# Patient Record
Sex: Male | Born: 1965 | Race: Black or African American | Hispanic: No | Marital: Single | Smoking: Light tobacco smoker
Health system: Southern US, Community
[De-identification: ages and names within clinical notes are randomized; demographics above are authoritative.]

## PROBLEM LIST (undated history)

## (undated) DIAGNOSIS — E785 Hyperlipidemia, unspecified: Secondary | ICD-10-CM

## (undated) DIAGNOSIS — I2510 Atherosclerotic heart disease of native coronary artery without angina pectoris: Secondary | ICD-10-CM

## (undated) DIAGNOSIS — Z720 Tobacco use: Secondary | ICD-10-CM

---

## 2012-07-19 ENCOUNTER — Ambulatory Visit (INDEPENDENT_AMBULATORY_CARE_PROVIDER_SITE_OTHER): Payer: BC Managed Care – PPO | Admitting: Physician Assistant

## 2012-07-19 ENCOUNTER — Ambulatory Visit: Payer: BC Managed Care – PPO

## 2012-07-19 VITALS — BP 120/90 | HR 88 | Temp 99.7°F | Resp 18 | Ht 70.0 in | Wt 181.0 lb

## 2012-07-19 DIAGNOSIS — R05 Cough: Secondary | ICD-10-CM

## 2012-07-19 DIAGNOSIS — R509 Fever, unspecified: Secondary | ICD-10-CM

## 2012-07-19 DIAGNOSIS — R059 Cough, unspecified: Secondary | ICD-10-CM

## 2012-07-19 DIAGNOSIS — R9389 Abnormal findings on diagnostic imaging of other specified body structures: Secondary | ICD-10-CM

## 2012-07-19 DIAGNOSIS — J111 Influenza due to unidentified influenza virus with other respiratory manifestations: Secondary | ICD-10-CM

## 2012-07-19 LAB — POCT CBC
Granulocyte percent: 71.4 %G (ref 37–80)
HCT, POC: 51.9 % (ref 43.5–53.7)
Hemoglobin: 17 g/dL (ref 14.1–18.1)
Lymph, poc: 2.1 (ref 0.6–3.4)
MCH, POC: 31.5 pg — AB (ref 27–31.2)
MCHC: 32.8 g/dL (ref 31.8–35.4)
MCV: 96.3 fL (ref 80–97)
MID (cbc): 0.8 (ref 0–0.9)
MPV: 6.9 fL (ref 0–99.8)
POC Granulocyte: 7.2 — AB (ref 2–6.9)
POC LYMPH PERCENT: 20.8 %L (ref 10–50)
POC MID %: 7.8 %M (ref 0–12)
Platelet Count, POC: 292 10*3/uL (ref 142–424)
RBC: 5.39 M/uL (ref 4.69–6.13)
RDW, POC: 14.2 %
WBC: 10.1 10*3/uL (ref 4.6–10.2)

## 2012-07-19 LAB — POCT INFLUENZA A/B
Influenza A, POC: POSITIVE
Influenza B, POC: POSITIVE

## 2012-07-19 MED ORDER — OSELTAMIVIR PHOSPHATE 75 MG PO CAPS: 75.0000 mg | ORAL_CAPSULE | Freq: Two times a day (BID) | ORAL | Status: DC

## 2012-07-19 NOTE — Progress Notes (Signed)
  Subjective:    Patient ID: Hayden Ramirez, male    DOB: 1966-06-08, 47 y.o.   MRN: [id number]  HPI 47 year old male presents with 3 day history of scratchy throat, fatigue, and fever with body aches.  Has slight cough but has no nasal congestion, otalgia, or sinus pressure.  Denies abdominal pain, nausea, vomiting, or dyspnea.  Since onset of this illness he has had      Review of Systems  Constitutional: Positive for fever and chills.  HENT: Positive for sore throat and postnasal drip. Negative for congestion and rhinorrhea.   Respiratory: Negative for cough, chest tightness and wheezing.   Cardiovascular: Negative for chest pain.  Gastrointestinal: Negative for nausea, vomiting and abdominal pain.  Skin: Negative for rash.  Neurological: Negative for dizziness, numbness and headaches.       Objective:   Physical Exam  Constitutional: He is oriented to person, place, and time. He appears well-developed and well-nourished.  HENT:  Head: Normocephalic and atraumatic.  Right Ear: Hearing, tympanic membrane, external ear and ear canal normal.  Left Ear: Hearing, tympanic membrane, external ear and ear canal normal.  Mouth/Throat: Uvula is midline, oropharynx is clear and moist and mucous membranes are normal. No oropharyngeal exudate or tonsillar abscesses.  Eyes: Conjunctivae normal are normal.  Neck: Normal range of motion. Neck supple.  Cardiovascular: Normal rate, regular rhythm and normal heart sounds.   Pulmonary/Chest: Effort normal. He has wheezes (slight wheezes in bilateral bases).  Lymphadenopathy:    He has no cervical adenopathy.  Neurological: He is alert and oriented to person, place, and time.  Skin: Nails show clubbing.  Psychiatric: He has a normal mood and affect. His behavior is normal. Judgment and thought content normal.      [facility] reading (PRIMARY) by  Dr. Cleta Alberts as no acute infiltrate or consolidation.  RLL possible pulmonary nodule.      Assessment &  Plan:   1. Influenza    2. Cough  POCT CBC, DG Chest 2 View  3. Fever  POCT CBC, POCT Influenza A/B  4. Abnormal chest x-ray  CT Chest W Contrast, Ambulatory referral to Pulmonology   Start Tamiflu bid x 5 days Ok to call in [place] if cough develops.  Tylenol and ibuprofen as needed for fever and chills Will go ahead and get CT scan of chest to evaluate possible pulmonary nodule.   Referral to Pulmonology for work up of possible clubbing of nails.  RTC if symptoms worsen or fail to improve.

## 2012-08-09 ENCOUNTER — Institutional Professional Consult (permissible substitution): Payer: Self-pay | Admitting: Internal Medicine

## 2015-07-09 ENCOUNTER — Ambulatory Visit (INDEPENDENT_AMBULATORY_CARE_PROVIDER_SITE_OTHER): Payer: BLUE CROSS/BLUE SHIELD | Admitting: Physician Assistant

## 2015-07-09 VITALS — BP 120/80 | HR 70 | Temp 98.6°F | Resp 16 | Ht 71.0 in | Wt 185.0 lb

## 2015-07-09 DIAGNOSIS — Z111 Encounter for screening for respiratory tuberculosis: Secondary | ICD-10-CM

## 2015-07-09 DIAGNOSIS — Z789 Other specified health status: Secondary | ICD-10-CM | POA: Diagnosis not present

## 2015-07-09 DIAGNOSIS — Z23 Encounter for immunization: Secondary | ICD-10-CM | POA: Diagnosis not present

## 2015-07-09 NOTE — Patient Instructions (Addendum)
I will contact you with the blood work and what test you need, pending my return phone call.  Tdap Vaccine (Tetanus, Diphtheria and Pertussis): What You Need to Know 1. Why get vaccinated? Tetanus, diphtheria and pertussis are very serious diseases. Tdap vaccine can protect [place] from these diseases. And, Tdap vaccine given to pregnant women can protect newborn babies against pertussis. TETANUS (Lockjaw) is rare in the [place] today. It causes painful muscle tightening and stiffness, usually all over the body.  It can lead to tightening of muscles in the head and neck so you can't open your mouth, swallow, or sometimes even breathe. Tetanus kills about 1 out of 10 people who are infected even after receiving the best medical care. DIPHTHERIA is also rare in the [place] today. It can cause a thick coating to form in the back of the throat.  It can lead to breathing problems, heart failure, paralysis, and death. PERTUSSIS (Whooping Cough) causes severe coughing spells, which can cause difficulty breathing, vomiting and disturbed sleep.  It can also lead to weight loss, incontinence, and rib fractures. Up to 2 in 100 adolescents and 5 in 100 adults with pertussis are hospitalized or have complications, which could include pneumonia or death. These diseases are caused by bacteria. Diphtheria and pertussis are spread from person to person through secretions from coughing or sneezing. Tetanus enters the body through cuts, scratches, or wounds. Before vaccines, as many as 200,000 cases of diphtheria, 200,000 cases of pertussis, and hundreds of cases of tetanus, were reported in the [place] each year. Since vaccination began, reports of cases for tetanus and diphtheria have dropped by about 99% and for pertussis by about 80%. 2. Tdap vaccine Tdap vaccine can protect adolescents and adults from tetanus, diphtheria, and pertussis. One dose of Tdap is routinely given at age 77 or 20. People who  did not get Tdap at that age should get it as soon as possible. Tdap is especially important for healthcare professionals and anyone having close contact with a baby younger than 12 months. Pregnant women should get a dose of Tdap during every pregnancy, to protect the newborn from pertussis. Infants are most at risk for severe, life-threatening complications from pertussis. Another vaccine, called Td, protects against tetanus and diphtheria, but not pertussis. A Td booster should be given every 10 years. Tdap may be given as one of these boosters if you have never gotten Tdap before. Tdap may also be given after a severe cut or burn to prevent tetanus infection. Your doctor or the person giving you the vaccine can give you more information. Tdap may safely be given at the same time as other vaccines. 3. Some people should not get this vaccine  A person who has ever had a life-threatening allergic reaction after a previous dose of any diphtheria, tetanus or pertussis containing vaccine, OR has a severe allergy to any part of this vaccine, should not get Tdap vaccine. Tell the person giving the vaccine about any severe allergies.  Anyone who had coma or long repeated seizures within 7 days after a childhood dose of DTP or DTaP, or a previous dose of Tdap, should not get Tdap, unless a cause other than the vaccine was found. They can still get Td.  Talk to your doctor if you:  have seizures or another nervous system problem,  had severe pain or swelling after any vaccine containing diphtheria, tetanus or pertussis,  ever had a condition called Guillain-Barr Syndrome (GBS),  aren't feeling well on the day the shot is scheduled. 4. Risks With any medicine, including vaccines, there is a chance of side effects. These are usually mild and go away on their own. Serious reactions are also possible but are rare. Most people who get Tdap vaccine do not have any problems with it. Mild problems  following Tdap (Did not interfere with activities)  Pain where the shot was given (about 3 in 4 adolescents or 2 in 3 adults)  Redness or swelling where the shot was given (about 1 person in 5)  Mild fever of at least 100.41F (up to about 1 in 25 adolescents or 1 in 100 adults)  Headache (about 3 or 4 people in 10)  Tiredness (about 1 person in 3 or 4)  Nausea, vomiting, diarrhea, stomach ache (up to 1 in 4 adolescents or 1 in 10 adults)  Chills, sore joints (about 1 person in 10)  Body aches (about 1 person in 3 or 4)  Rash, swollen glands (uncommon) Moderate problems following Tdap (Interfered with activities, but did not require medical attention)  Pain where the shot was given (up to 1 in 5 or 6)  Redness or swelling where the shot was given (up to about 1 in 16 adolescents or 1 in 12 adults)  Fever over 102F (about 1 in 100 adolescents or 1 in 250 adults)  Headache (about 1 in 7 adolescents or 1 in 10 adults)  Nausea, vomiting, diarrhea, stomach ache (up to 1 or 3 people in 100)  Swelling of the entire arm where the shot was given (up to about 1 in 500). Severe problems following Tdap (Unable to perform usual activities; required medical attention)  Swelling, severe pain, bleeding and redness in the arm where the shot was given (rare). Problems that could happen after any vaccine:  People sometimes faint after a medical procedure, including vaccination. Sitting or lying down for about 15 minutes can help prevent fainting, and injuries caused by a fall. Tell your doctor if you feel dizzy, or have vision changes or ringing in the ears.  Some people get severe pain in the shoulder and have difficulty moving the arm where a shot was given. This happens very rarely.  Any medication can cause a severe allergic reaction. Such reactions from a vaccine are very rare, estimated at fewer than 1 in a million doses, and would happen within a few minutes to a few hours after the  vaccination. As with any medicine, there is a very remote chance of a vaccine causing a serious injury or death. The safety of vaccines is always being monitored. For more information, visit: http://floyd.org/ 5. What if there is a serious problem? What should I look for?  Look for anything that concerns you, such as signs of a severe allergic reaction, very high fever, or unusual behavior.  Signs of a severe allergic reaction can include hives, swelling of the face and throat, difficulty breathing, a fast heartbeat, dizziness, and weakness. These would usually start a few minutes to a few hours after the vaccination. What should I do?  If you think it is a severe allergic reaction or other emergency that can't wait, call 9-1-1 or get the person to the nearest hospital. Otherwise, call your doctor.  Afterward, the reaction should be reported to the Vaccine Adverse Event Reporting System (VAERS). Your doctor might file this report, or you can do it yourself through the VAERS web site at www.vaers.LAgents.no, or by calling 1-304-819-0799. VAERS  does not give medical advice.  6. The National Vaccine Injury Compensation Program The [employer] Vaccine Injury Compensation Program (VICP) is a federal program that was created to compensate people who may have been injured by certain vaccines. Persons who believe they may have been injured by a vaccine can learn about the program and about filing a claim by calling 1-(364)145-6841 or visiting the VICP website at SpiritualWord.at. There is a time limit to file a claim for compensation. 7. How can I learn more?  Ask your doctor. He or she can give you the vaccine package insert or suggest other sources of information.  Call your local or state health department.  Contact the Centers for Disease Control and Prevention (CDC):  Call 586-637-1881 (1-800-CDC-INFO) or  Visit CDC's website at PicCapture.uy CDC Tdap Vaccine VIS  (08/20/13)   This information is not intended to replace advice given to you by your health care provider. Make sure you discuss any questions you have with your health care provider.   Document Released: 12/13/2011 Document Revised: 07/04/2014 Document Reviewed: 09/25/2013 Elsevier Interactive Patient Education 2016 Elsevier Inc.  Tuberculin Skin Test WHY AM I HAVING THIS TEST? Tuberculosis (TB) is a bacterial infection caused by Mycobacterium tuberculosis. Most people who are exposed to these bacteria have a strong enough defense (immune) system to prevent the bacteria from causing TB and developing symptoms. Their bodies prevent the germs from being active and making them sick (latent TB infection).  However, if you have TB germs in your body and your immune system is weak, you can develop a TB infection. This can cause symptoms such as:   Night sweats.  Fever.  Weakness.  Weight loss. A latent TB infection can also become active later in life if your immune system becomes weakened or compromised. You may have this test if your health care provider suspects that you have TB. You may also have this test to screen for TB if you are at risk for getting the disease. Those at increased risk include:  People who inject illegal drugs or share needles.  People with HIV or other diseases that affect immunity.  Health care workers.  People who live in high-risk communities, such as homeless shelters, nursing homes, and correctional facilities.  People who have been in contact with someone with TB.  People from countries where TB is more common. If you are in a high-risk group, your health care provider may wish to screen for TB more often. This can help prevent the spread of the disease. Sometimes TB screening is required when starting a new job, such as becoming a Scientist, forensic or a Runner, broadcasting/film/video. Colleges or universities may require it of new students. HOW WILL I BE TESTED? A tuberculin  skin test is the main test used to check for exposure to the bacteria that can cause TB. The test checks for antibodies to the bacteria. Antibodies are proteins that your body produces to protect you from germs and other things that can make you sick. Your health care provider will inject a solution known as PPD (purified protein derivative) under the first layer of skin on your arm. This causes a blister-like bubble to form at the site. Your health care provider will then examine the site after a number of hours have passed to see if a reaction has occurred. HOW DO I PREPARE FOR THE TEST? There is no preparation required for this test. WHAT DO THE RESULTS MEAN? Your test results will be reported as  either negative or positive.  If the tuberculin skin test produces a negative result, it is likely that you do not have TB and have not been exposed to the TB bacteria. If you or your health care provider suspects exposure, however, you may want to repeat the test a few weeks later. A blood test may also be used to check for TB. This is because you will not react to the tuberculin skin test until several weeks after exposure to TB bacteria. If you test positive to the tuberculin skin test, it is likely that you have been exposed to TB bacteria. The test does not distinguish between an active and a latent TB infection. A false-positive result can occur. A false-positive result for TB bacteria is incorrect because it indicates a condition or finding is present when it is not. Talk to your health care provider to discuss your results, treatment options, and if necessary, the need for more tests. It is your responsibility to obtain your test results. Ask the lab or department performing the test when and how you will get your results. Talk with your health care provider if you have any questions about your results.   This information is not intended to replace advice given to you by your health care provider.  Make sure you discuss any questions you have with your health care provider.   Document Released: 03/23/2005 Document Revised: 07/04/2014 Document Reviewed: 10/07/2013 Elsevier Interactive Patient Education 2016 Elsevier Inc.  Influenza Virus Vaccine injection (Fluarix) What is this medicine? INFLUENZA VIRUS VACCINE (in floo EN zuh VAHY ruhs vak SEEN) helps to reduce the risk of getting influenza also known as the flu. This medicine may be used for other purposes; ask your health care provider or pharmacist if you have questions. What should I tell my health care provider before I take this medicine? They need to know if you have any of these conditions: -bleeding disorder like hemophilia -fever or infection -Guillain-Barre syndrome or other neurological problems -immune system problems -infection with the human immunodeficiency virus (HIV) or AIDS -low blood platelet counts -multiple sclerosis -an unusual or allergic reaction to influenza virus vaccine, eggs, chicken proteins, latex, gentamicin, other medicines, foods, dyes or preservatives -pregnant or trying to get pregnant -breast-feeding How should I use this medicine? This vaccine is for injection into a muscle. It is given by a health care professional. A copy of Vaccine Information Statements will be given before each vaccination. Read this sheet carefully each time. The sheet may change frequently. Talk to your pediatrician regarding the use of this medicine in children. Special care may be needed. Overdosage: If you think you have taken too much of this medicine contact a poison control center or emergency room at once. NOTE: This medicine is only for you. Do not share this medicine with others. What if I miss a dose? This does not apply. What may interact with this medicine? -chemotherapy or radiation therapy -medicines that lower your immune system like etanercept, anakinra, infliximab, and adalimumab -medicines that  treat or prevent blood clots like warfarin -phenytoin -steroid medicines like prednisone or cortisone -theophylline -vaccines This list may not describe all possible interactions. Give your health care provider a list of all the medicines, herbs, non-prescription drugs, or dietary supplements you use. Also tell them if you smoke, drink alcohol, or use illegal drugs. Some items may interact with your medicine. What should I watch for while using this medicine? Report any side effects that do not go away  within 3 days to your doctor or health care professional. Call your health care provider if any unusual symptoms occur within 6 weeks of receiving this vaccine. You may still catch the flu, but the illness is not usually as bad. You cannot get the flu from the vaccine. The vaccine will not protect against colds or other illnesses that may cause fever. The vaccine is needed every year. What side effects may I notice from receiving this medicine? Side effects that you should report to your doctor or health care professional as soon as possible: -allergic reactions like skin rash, itching or hives, swelling of the face, lips, or tongue Side effects that usually do not require medical attention (report to your doctor or health care professional if they continue or are bothersome): -fever -headache -muscle aches and pains -pain, tenderness, redness, or swelling at site where injected -weak or tired This list may not describe all possible side effects. Call your doctor for medical advice about side effects. You may report side effects to FDA at 1-800-FDA-1088. Where should I keep my medicine? This vaccine is only given in a clinic, pharmacy, doctor's office, or other health care setting and will not be stored at home. NOTE: This sheet is a summary. It may not cover all possible information. If you have questions about this medicine, talk to your doctor, pharmacist, or health care provider.    2016,  Elsevier/Gold Standard. (2008-01-09 09:30:40)

## 2015-07-09 NOTE — Progress Notes (Signed)
Chief Complaint  Patient presents with  . Immunizations    ppd and mmr   HPI Patient is here today for immunizations for his workplace.  Vendor requirement from [facility].  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   He has never had a positive TB.   He has no medical records or means of obtaining records. He is from [facility], and went to [employer].  Tuberculosis Risk Questionnaire  1. No Were you born outside the [place] in one of the following parts of the world: [place], [place], [place], [place] or [place]?    2. No Have you traveled outside the [place] and lived for more than one month in one of the following parts of the world: [place], [place], [place], [place] or [place]?    3. No Do you have a compromised immune system such as from any of the following conditions:HIV/AIDS, organ or bone marrow transplantation, diabetes, immunosuppressive medicines (e.g. Prednisone, Remicaide), leukemia, lymphoma, cancer of the head or neck, gastrectomy or jejunal bypass, end-stage renal disease (on dialysis), or silicosis?     4. 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 --working at a hospital    5. No Have you ever: injected illegal drugs, used crack cocaine, lived in a homeless shelter  or been in jail or prison?     6. No Have you ever been exposed to anyone with infectious tuberculosis?    Tuberculosis Symptom Questionnaire  Do you currently have any of the following symptoms?  1. No Unexplained cough lasting more than 3 weeks?   2. No Unexplained fever lasting more than 3 weeks.   3. No Night Sweats (sweating that leaves the bedclothes and sheets wet)     4. No Shortness of Breath   5. No Chest Pain   6. No Unintentional weight loss    7. No Unexplained fatigue (very tired for no reason)    No current  Medications at this time.  History reviewed. No pertinent past surgical history.  History reviewed. No pertinent family history.  Social History   Social History  . Marital Status: Single    Spouse Name: N/A  . Number of Children: N/A  . Years of Education: N/A   Social History Main Topics  . Smoking status: Current Every Day Smoker  . Smokeless tobacco: None  . Alcohol Use: Yes  . Drug Use: No  . Sexual Activity: Not Asked   Other Topics Concern  . None   Social History Narrative   Physical Exam  Constitutional: He is oriented to person, place, and time. He appears well-developed and well-nourished. No distress.  HENT:  Head: Normocephalic and atraumatic.  Eyes: Conjunctivae and EOM are normal. Pupils are equal, round, and reactive to light.  Cardiovascular: Normal rate.   Pulmonary/Chest: Effort normal. No respiratory distress.  Neurological: He is alert and oriented to person, place, and time.  Skin: Skin is warm and dry. He is not diaphoretic.  Psychiatric: He has a normal mood and affect. His behavior is normal.    Assessment and Plan: 50 year old male is here today for immunizations. Several phone calls were spent contact [facility] [place] to obtain his medical records.  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   Placed tdap and flu vaccine He  will have the first skin test today and another after at least one week. I will place a copy of his forms in my personal box, if he needs to return MMR and varicella titer performed today.  Encounter for immunization  PPD screening test - Plan: TB Skin Test  Need for prophylactic vaccination and inoculation against influenza - Plan: Flu Vaccine QUAD 36+ mos IM  Need for Tdap vaccination - Plan: Tdap vaccine greater than or equal to 7yo IM  Measles, mumps, rubella (MMR) vaccination status unknown - Plan: Measles/Mumps/Rubella Immunity  Unknown varicella vaccination  status - Plan: Varicella zoster antibody, IgG   Ivar Drape, PA-C [facility] 1/13/20179:06 AM

## 2015-07-10 ENCOUNTER — Other Ambulatory Visit: Payer: BLUE CROSS/BLUE SHIELD

## 2015-07-10 ENCOUNTER — Encounter: Payer: Self-pay | Admitting: Physician Assistant

## 2015-07-10 ENCOUNTER — Telehealth: Payer: Self-pay | Admitting: Physician Assistant

## 2015-07-10 NOTE — Telephone Encounter (Signed)
Pt will come in this afternoon

## 2015-07-10 NOTE — Telephone Encounter (Signed)
I'm sure they drew it, but we can't send [facility] without any orders. They would have discarded it at the end of the day. I will call pt and have him RTC

## 2015-07-10 NOTE — Telephone Encounter (Signed)
I placed an MMR and Varicella titer for a patient after making many contacts.  I told the lab beforehand to draw his blood, and if I had no contacts of his immunization records, I would go ahead and order the titers.  It appears that they never sent the labs once I placed them.  Please see if these labs were sent off.  If not, the patient needs to be called before he returns for his tb read, to advise that he needs to also get his blood drawn again, unfortunately.

## 2015-07-11 ENCOUNTER — Encounter: Payer: BLUE CROSS/BLUE SHIELD | Admitting: *Deleted

## 2015-07-11 DIAGNOSIS — Z111 Encounter for screening for respiratory tuberculosis: Secondary | ICD-10-CM

## 2015-07-11 LAB — TB SKIN TEST
Induration: 0 mm
TB Skin Test: NEGATIVE

## 2015-07-13 LAB — VARICELLA ZOSTER ANTIBODY, IGG: VARICELLA IGG: 2840 {index} — AB (ref <135.00)

## 2015-07-13 LAB — MEASLES/MUMPS/RUBELLA IMMUNITY
MUMPS IGG: 23.7 [AU]/ml — AB (ref <9.00)
RUBELLA: 0.72 {index} (ref <0.90)

## 2015-07-14 NOTE — Telephone Encounter (Signed)
Please advise the patient that he is not immune to measles or rubella and must have this immunization per his job requirement.  Let's finish the tb test AND reads (he must do 2 TB skin test at least a week apart).  After that 2nd TB skin test reading is negative--we can do the MMR vaccine and repeat the 2nd dose in 1 month.  He is able to start his job in between the 1st and 2nd dose.

## 2015-07-15 NOTE — Telephone Encounter (Signed)
Pt notified. Future order placed for MMR

## 2015-07-23 ENCOUNTER — Ambulatory Visit (INDEPENDENT_AMBULATORY_CARE_PROVIDER_SITE_OTHER): Payer: BLUE CROSS/BLUE SHIELD

## 2015-07-23 DIAGNOSIS — Z111 Encounter for screening for respiratory tuberculosis: Secondary | ICD-10-CM | POA: Diagnosis not present

## 2015-07-26 ENCOUNTER — Ambulatory Visit (INDEPENDENT_AMBULATORY_CARE_PROVIDER_SITE_OTHER): Payer: BLUE CROSS/BLUE SHIELD | Admitting: Family Medicine

## 2015-07-26 ENCOUNTER — Encounter: Payer: Self-pay | Admitting: Family Medicine

## 2015-07-26 DIAGNOSIS — Z23 Encounter for immunization: Secondary | ICD-10-CM

## 2015-07-26 LAB — TB SKIN TEST
Induration: 0 mm
TB Skin Test: NEGATIVE

## 2015-07-26 NOTE — Patient Instructions (Signed)
Return in one month for second MMR

## 2015-07-26 NOTE — Progress Notes (Signed)
   Subjective:    Patient ID: Hayden Ramirez, male    DOB: July 24, 1965, 50 y.o.   MRN: [id number]  07/26/2015  Immunizations   HPI This 50 y.o. male presents for follow-up for Tb skin test read and MMR.  Also needs form completed for school.  Recent lab results reviewed in detail. CMA visit only.     Review of Systems  Constitutional: Negative for fever, chills, diaphoresis and fatigue.    No past medical history on file. No past surgical history on file. No Known Allergies Current Outpatient Prescriptions  Medication Sig Dispense Refill  . oseltamivir (TAMIFLU) 75 MG capsule Take 1 capsule (75 mg total) by mouth 2 (two) times daily. (Patient not taking: Reported on 07/09/2015) 10 capsule 0   No current facility-administered medications for this visit.   Social History   Social History  . Marital Status: Single    Spouse Name: N/A  . Number of Children: N/A  . Years of Education: N/A   Occupational History  . Not on file.   Social History Main Topics  . Smoking status: Current Every Day Smoker  . Smokeless tobacco: Not on file  . Alcohol Use: Yes  . Drug Use: No  . Sexual Activity: Not on file   Other Topics Concern  . Not on file   Social History Narrative   No family history on file.     Objective:    There were no vitals taken for this visit. Physical Exam  Constitutional: He appears well-developed and well-nourished.   Results for orders placed or performed in visit on 07/23/15  TB Skin Test  Result Value Ref Range   TB Skin Test Negative    Induration 0 mm       Assessment & Plan:   1. Need for MMR vaccine    S/p MMR. Tb skin test negative. Form completed/signed.  No orders of the defined types were placed in this encounter.   No orders of the defined types were placed in this encounter.    No Follow-up on file.    Catera Hankins Elayne Guerin, M.D. [facility] [address] [place], [place]   [address] [phone] phone [phone] fax

## 2016-01-30 ENCOUNTER — Encounter (HOSPITAL_COMMUNITY): Payer: Self-pay

## 2016-01-30 ENCOUNTER — Inpatient Hospital Stay (HOSPITAL_COMMUNITY)
Admission: EM | Admit: 2016-01-30 | Discharge: 2016-02-02 | DRG: 247 | Disposition: A | Discharge status: Home / Self Care | Payer: BLUE CROSS/BLUE SHIELD | Attending: Interventional Cardiology | Admitting: Interventional Cardiology

## 2016-01-30 ENCOUNTER — Encounter (HOSPITAL_COMMUNITY)
Admission: EM | Disposition: A | Discharge status: Home / Self Care | Payer: Self-pay | Source: Home / Self Care | Attending: Interventional Cardiology

## 2016-01-30 ENCOUNTER — Emergency Department (HOSPITAL_COMMUNITY): Payer: BLUE CROSS/BLUE SHIELD

## 2016-01-30 ENCOUNTER — Ambulatory Visit (HOSPITAL_COMMUNITY): Admission: EM | Admit: 2016-01-30 | Payer: Self-pay | Admitting: Interventional Cardiology

## 2016-01-30 ENCOUNTER — Other Ambulatory Visit: Payer: Self-pay

## 2016-01-30 DIAGNOSIS — E785 Hyperlipidemia, unspecified: Secondary | ICD-10-CM | POA: Diagnosis not present

## 2016-01-30 DIAGNOSIS — N179 Acute kidney failure, unspecified: Secondary | ICD-10-CM | POA: Diagnosis present

## 2016-01-30 DIAGNOSIS — I2119 ST elevation (STEMI) myocardial infarction involving other coronary artery of inferior wall: Principal | ICD-10-CM | POA: Diagnosis present

## 2016-01-30 DIAGNOSIS — I213 ST elevation (STEMI) myocardial infarction of unspecified site: Secondary | ICD-10-CM | POA: Diagnosis present

## 2016-01-30 DIAGNOSIS — Z955 Presence of coronary angioplasty implant and graft: Secondary | ICD-10-CM

## 2016-01-30 DIAGNOSIS — Z8249 Family history of ischemic heart disease and other diseases of the circulatory system: Secondary | ICD-10-CM

## 2016-01-30 DIAGNOSIS — F17210 Nicotine dependence, cigarettes, uncomplicated: Secondary | ICD-10-CM | POA: Diagnosis not present

## 2016-01-30 DIAGNOSIS — Z720 Tobacco use: Secondary | ICD-10-CM

## 2016-01-30 DIAGNOSIS — I10 Essential (primary) hypertension: Secondary | ICD-10-CM | POA: Diagnosis present

## 2016-01-30 DIAGNOSIS — I255 Ischemic cardiomyopathy: Secondary | ICD-10-CM | POA: Diagnosis present

## 2016-01-30 DIAGNOSIS — I493 Ventricular premature depolarization: Secondary | ICD-10-CM | POA: Diagnosis not present

## 2016-01-30 DIAGNOSIS — I2121 ST elevation (STEMI) myocardial infarction involving left circumflex coronary artery: Secondary | ICD-10-CM

## 2016-01-30 DIAGNOSIS — I2510 Atherosclerotic heart disease of native coronary artery without angina pectoris: Secondary | ICD-10-CM

## 2016-01-30 HISTORY — PX: CARDIAC CATHETERIZATION: SHX172

## 2016-01-30 HISTORY — DX: Atherosclerotic heart disease of native coronary artery without angina pectoris: I25.10

## 2016-01-30 HISTORY — DX: Tobacco use: Z72.0

## 2016-01-30 HISTORY — DX: Hyperlipidemia, unspecified: E78.5

## 2016-01-30 LAB — CBC WITH DIFFERENTIAL/PLATELET
Basophils Absolute: 0 10*3/uL (ref 0.0–0.1)
Basophils Relative: 0 %
Eosinophils Absolute: 0.1 10*3/uL (ref 0.0–0.7)
Eosinophils Relative: 1 %
HEMATOCRIT: 44.2 % (ref 39.0–52.0)
HEMOGLOBIN: 15.3 g/dL (ref 13.0–17.0)
LYMPHS ABS: 5.5 10*3/uL — AB (ref 0.7–4.0)
LYMPHS PCT: 37 %
MCH: 30.8 pg (ref 26.0–34.0)
MCHC: 34.6 g/dL (ref 30.0–36.0)
MCV: 89.1 fL (ref 78.0–100.0)
MONOS PCT: 7 %
Monocytes Absolute: 1 10*3/uL (ref 0.1–1.0)
NEUTROS ABS: 8 10*3/uL — AB (ref 1.7–7.7)
NEUTROS PCT: 55 %
Platelets: 297 10*3/uL (ref 150–400)
RBC: 4.96 MIL/uL (ref 4.22–5.81)
RDW: 14.1 % (ref 11.5–15.5)
WBC: 14.6 10*3/uL — AB (ref 4.0–10.5)

## 2016-01-30 LAB — I-STAT CHEM 8, ED
BUN: 10 mg/dL (ref 6–20)
CALCIUM ION: 1.14 mmol/L (ref 1.13–1.30)
CHLORIDE: 100 mmol/L — AB (ref 101–111)
Creatinine, Ser: 1.4 mg/dL — ABNORMAL HIGH (ref 0.61–1.24)
GLUCOSE: 124 mg/dL — AB (ref 65–99)
HCT: 47 % (ref 39.0–52.0)
Hemoglobin: 16 g/dL (ref 13.0–17.0)
POTASSIUM: 3 mmol/L — AB (ref 3.5–5.1)
SODIUM: 140 mmol/L (ref 135–145)
TCO2: 22 mmol/L (ref 0–100)

## 2016-01-30 LAB — BASIC METABOLIC PANEL
Anion gap: 11 (ref 5–15)
BUN: 11 mg/dL (ref 6–20)
CHLORIDE: 103 mmol/L (ref 101–111)
CO2: 22 mmol/L (ref 22–32)
CREATININE: 1.36 mg/dL — AB (ref 0.61–1.24)
Calcium: 9.4 mg/dL (ref 8.9–10.3)
GFR calc Af Amer: >60 mL/min (ref >60)
GFR calc non Af Amer: >60 mL/min (ref >60)
Glucose, Bld: 129 mg/dL — ABNORMAL HIGH (ref 65–99)
POTASSIUM: 2.9 mmol/L — AB (ref 3.5–5.1)
SODIUM: 136 mmol/L (ref 135–145)

## 2016-01-30 LAB — POCT ACTIVATED CLOTTING TIME: ACTIVATED CLOTTING TIME: 367 s

## 2016-01-30 LAB — I-STAT TROPONIN, ED: TROPONIN I, POC: 0 ng/mL (ref 0.00–0.08)

## 2016-01-30 LAB — TROPONIN I

## 2016-01-30 LAB — PROTIME-INR
INR: 0.94
Prothrombin Time: 12.6 seconds (ref 11.4–15.2)

## 2016-01-30 SURGERY — LEFT HEART CATH AND CORONARY ANGIOGRAPHY
Anesthesia: LOCAL

## 2016-01-30 MED ORDER — ACETAMINOPHEN 325 MG PO TABS: 650.0000 mg | ORAL_TABLET | ORAL | Status: DC | PRN

## 2016-01-30 MED ORDER — HEPARIN (PORCINE) IN NACL 2-0.9 UNIT/ML-% IJ SOLN
INTRAMUSCULAR | Status: DC | PRN
  Administered 2016-01-30: 1500 mL

## 2016-01-30 MED ORDER — HEPARIN (PORCINE) IN NACL 100-0.45 UNIT/ML-% IJ SOLN
INTRAMUSCULAR | Status: AC
  Filled 2016-01-30: qty 250

## 2016-01-30 MED ORDER — AMIODARONE HCL 150 MG/3ML IV SOLN
INTRAVENOUS | Status: AC
  Filled 2016-01-30: qty 3

## 2016-01-30 MED ORDER — SODIUM CHLORIDE 0.9 % WEIGHT BASED INFUSION
1.0000 mL/kg/h | INTRAVENOUS | Status: AC
  Administered 2016-01-31: 1 mL/kg/h via INTRAVENOUS

## 2016-01-30 MED ORDER — IOPAMIDOL (ISOVUE-370) INJECTION 76%
INTRAVENOUS | Status: DC | PRN
  Administered 2016-01-30: 120 mL via INTRAVENOUS

## 2016-01-30 MED ORDER — LIDOCAINE HCL (PF) 1 % IJ SOLN
INTRAMUSCULAR | Status: AC
  Filled 2016-01-30: qty 30

## 2016-01-30 MED ORDER — IOPAMIDOL (ISOVUE-370) INJECTION 76%
INTRAVENOUS | Status: AC
  Filled 2016-01-30: qty 100

## 2016-01-30 MED ORDER — TIROFIBAN HCL IN NACL 5-0.9 MG/100ML-% IV SOLN
INTRAVENOUS | Status: AC
  Filled 2016-01-30: qty 100

## 2016-01-30 MED ORDER — ASPIRIN 81 MG PO CHEW
CHEWABLE_TABLET | ORAL | Status: AC
  Filled 2016-01-30: qty 4

## 2016-01-30 MED ORDER — HEPARIN (PORCINE) IN NACL 100-0.45 UNIT/ML-% IJ SOLN: 1000.0000 [IU]/h | INTRAMUSCULAR | Status: DC

## 2016-01-30 MED ORDER — VERAPAMIL HCL 2.5 MG/ML IV SOLN
INTRAVENOUS | Status: AC
Start: 2016-01-30 — End: 2016-01-30
  Filled 2016-01-30: qty 2

## 2016-01-30 MED ORDER — SODIUM CHLORIDE 0.9% FLUSH
3.0000 mL | INTRAVENOUS | Status: DC | PRN
  Administered 2016-02-01: 3 mL via INTRAVENOUS
  Filled 2016-01-30: qty 3

## 2016-01-30 MED ORDER — SODIUM CHLORIDE 0.9 % IV SOLN
INTRAVENOUS | Status: DC | PRN
  Administered 2016-01-30: 100 mL/h via INTRAVENOUS

## 2016-01-30 MED ORDER — HEPARIN SODIUM (PORCINE) 1000 UNIT/ML IJ SOLN
INTRAMUSCULAR | Status: AC
  Filled 2016-01-30: qty 1

## 2016-01-30 MED ORDER — ATORVASTATIN CALCIUM 80 MG PO TABS
80.0000 mg | ORAL_TABLET | Freq: Every day | ORAL | Status: DC
  Administered 2016-01-31 – 2016-02-01 (×2): 80 mg via ORAL
  Filled 2016-01-30 (×2): qty 1

## 2016-01-30 MED ORDER — NITROGLYCERIN 1 MG/10 ML FOR IR/CATH LAB
INTRA_ARTERIAL | Status: AC
  Filled 2016-01-30: qty 10

## 2016-01-30 MED ORDER — MIDAZOLAM HCL 2 MG/2ML IJ SOLN
INTRAMUSCULAR | Status: AC
  Filled 2016-01-30: qty 2

## 2016-01-30 MED ORDER — SODIUM CHLORIDE 0.9% FLUSH
3.0000 mL | Freq: Two times a day (BID) | INTRAVENOUS | Status: DC
  Administered 2016-01-31 – 2016-02-02 (×6): 3 mL via INTRAVENOUS

## 2016-01-30 MED ORDER — AMIODARONE HCL IN DEXTROSE 360-4.14 MG/200ML-% IV SOLN
INTRAVENOUS | Status: DC | PRN
  Administered 2016-01-30: 60 mg/h via INTRAVENOUS

## 2016-01-30 MED ORDER — METOPROLOL TARTRATE 12.5 MG HALF TABLET
12.5000 mg | ORAL_TABLET | Freq: Two times a day (BID) | ORAL | Status: DC
  Administered 2016-01-31 – 2016-02-02 (×6): 12.5 mg via ORAL
  Filled 2016-01-30 (×6): qty 1

## 2016-01-30 MED ORDER — FENTANYL CITRATE (PF) 100 MCG/2ML IJ SOLN
INTRAMUSCULAR | Status: DC | PRN
  Administered 2016-01-30: 25 ug via INTRAVENOUS

## 2016-01-30 MED ORDER — TIROFIBAN (AGGRASTAT) BOLUS VIA INFUSION
INTRAVENOUS | Status: DC | PRN
  Administered 2016-01-30: 1985 ug via INTRAVENOUS

## 2016-01-30 MED ORDER — ASPIRIN 81 MG PO CHEW
324.0000 mg | CHEWABLE_TABLET | Freq: Once | ORAL | Status: AC
  Administered 2016-01-30: 324 mg via ORAL

## 2016-01-30 MED ORDER — MIDAZOLAM HCL 2 MG/2ML IJ SOLN
INTRAMUSCULAR | Status: DC | PRN
Start: 2016-01-30 — End: 2016-01-30
  Administered 2016-01-30: 2 mg via INTRAVENOUS

## 2016-01-30 MED ORDER — HEPARIN (PORCINE) IN NACL 2-0.9 UNIT/ML-% IJ SOLN
INTRAMUSCULAR | Status: AC
  Filled 2016-01-30: qty 1500

## 2016-01-30 MED ORDER — FENTANYL CITRATE (PF) 100 MCG/2ML IJ SOLN
50.0000 ug | Freq: Once | INTRAMUSCULAR | Status: AC
Start: 2016-01-30 — End: 2016-01-30
  Administered 2016-01-30: 50 ug via INTRAVENOUS

## 2016-01-30 MED ORDER — TICAGRELOR 90 MG PO TABS
ORAL_TABLET | ORAL | Status: DC | PRN
  Administered 2016-01-30: 180 mg via ORAL

## 2016-01-30 MED ORDER — TICAGRELOR 90 MG PO TABS
ORAL_TABLET | ORAL | Status: AC
  Filled 2016-01-30: qty 2

## 2016-01-30 MED ORDER — FENTANYL CITRATE (PF) 100 MCG/2ML IJ SOLN
INTRAMUSCULAR | Status: AC
  Filled 2016-01-30: qty 2

## 2016-01-30 MED ORDER — TIROFIBAN HCL IN NACL 5-0.9 MG/100ML-% IV SOLN
INTRAVENOUS | Status: DC | PRN
  Administered 2016-01-30: 0.15 ug/kg/min via INTRAVENOUS

## 2016-01-30 MED ORDER — LIDOCAINE HCL (PF) 1 % IJ SOLN
INTRAMUSCULAR | Status: DC | PRN
  Administered 2016-01-30: 3 mL

## 2016-01-30 MED ORDER — HEPARIN (PORCINE) IN NACL 100-0.45 UNIT/ML-% IJ SOLN
1000.0000 [IU]/h | Freq: Once | INTRAMUSCULAR | Status: AC
  Administered 2016-01-30: 1000 [IU]/h via INTRAVENOUS

## 2016-01-30 MED ORDER — HEPARIN SODIUM (PORCINE) 1000 UNIT/ML IJ SOLN
INTRAMUSCULAR | Status: DC | PRN
  Administered 2016-01-30: 8000 [IU] via INTRAVENOUS

## 2016-01-30 MED ORDER — ASPIRIN 81 MG PO CHEW
81.0000 mg | CHEWABLE_TABLET | Freq: Every day | ORAL | Status: DC
  Administered 2016-01-31 – 2016-02-02 (×3): 81 mg via ORAL
  Filled 2016-01-30 (×3): qty 1

## 2016-01-30 MED ORDER — VERAPAMIL HCL 2.5 MG/ML IV SOLN
INTRAVENOUS | Status: DC | PRN
  Administered 2016-01-30: 10 mL via INTRA_ARTERIAL

## 2016-01-30 MED ORDER — TICAGRELOR 90 MG PO TABS
90.0000 mg | ORAL_TABLET | Freq: Two times a day (BID) | ORAL | Status: DC
  Administered 2016-01-31 – 2016-02-02 (×5): 90 mg via ORAL
  Filled 2016-01-30 (×5): qty 1

## 2016-01-30 MED ORDER — HEPARIN SODIUM (PORCINE) 5000 UNIT/ML IJ SOLN
4000.0000 [IU] | Freq: Once | INTRAMUSCULAR | Status: AC
  Administered 2016-01-30: 4000 [IU] via INTRAVENOUS

## 2016-01-30 MED ORDER — ONDANSETRON HCL 4 MG/2ML IJ SOLN
4.0000 mg | Freq: Four times a day (QID) | INTRAMUSCULAR | Status: DC | PRN
  Administered 2016-01-31: 4 mg via INTRAVENOUS
  Filled 2016-01-30: qty 2

## 2016-01-30 MED ORDER — NITROGLYCERIN 1 MG/10 ML FOR IR/CATH LAB
INTRA_ARTERIAL | Status: DC | PRN
  Administered 2016-01-30: 200 ug via INTRACORONARY
  Administered 2016-01-30: 100 ug via INTRACORONARY

## 2016-01-30 MED ORDER — AMIODARONE HCL IN DEXTROSE 360-4.14 MG/200ML-% IV SOLN
INTRAVENOUS | Status: AC
  Filled 2016-01-30: qty 200

## 2016-01-30 MED ORDER — AMIODARONE LOAD VIA INFUSION
INTRAVENOUS | Status: DC | PRN
  Administered 2016-01-30: 150 mg via INTRAVENOUS

## 2016-01-30 MED ORDER — TIROFIBAN HCL IN NACL 5-0.9 MG/100ML-% IV SOLN
0.1500 ug/kg/min | INTRAVENOUS | Status: AC
Start: 2016-01-31 — End: 2016-01-31
  Administered 2016-01-31: 0.15 ug/kg/min via INTRAVENOUS
  Filled 2016-01-30: qty 100

## 2016-01-30 MED ORDER — SODIUM CHLORIDE 0.9 % IV SOLN
250.0000 mL | INTRAVENOUS | Status: DC | PRN
  Administered 2016-01-31: 10 mL/h via INTRAVENOUS

## 2016-01-30 SURGICAL SUPPLY — 26 items
BALLN EMERGE MR 2.5X15 (BALLOONS) ×2
BALLN ~~LOC~~ EMERGE MR 3.25X15 (BALLOONS) ×2
BALLN ~~LOC~~ EUPHORA RX 3.75X8 (BALLOONS) ×2
BALLOON EMERGE MR 2.5X15 (BALLOONS) ×1 IMPLANT
BALLOON ~~LOC~~ EMERGE MR 3.25X15 (BALLOONS) ×1 IMPLANT
BALLOON ~~LOC~~ EUPHORA RX 3.75X8 (BALLOONS) ×1 IMPLANT
CATH EXTRAC PRONTO 5.5F 138CM (CATHETERS) ×2 IMPLANT
CATH INFINITI 5 FR JL3.5 (CATHETERS) ×2 IMPLANT
CATH INFINITI JR4 5F (CATHETERS) ×2 IMPLANT
CATH VISTA GUIDE 6FR JR4 (CATHETERS) ×2 IMPLANT
DEVICE RAD COMP TR BAND LRG (VASCULAR PRODUCTS) ×2 IMPLANT
ELECT DEFIB PAD ADLT CADENCE (PAD) ×2 IMPLANT
GLIDESHEATH SLEND SS 6F .021 (SHEATH) ×2 IMPLANT
GUIDE CATH RUNWAY 6FR CLS3 (CATHETERS) ×2 IMPLANT
KIT ENCORE 26 ADVANTAGE (KITS) ×2 IMPLANT
KIT HEART LEFT (KITS) ×2 IMPLANT
PACK CARDIAC CATHETERIZATION (CUSTOM PROCEDURE TRAY) ×2 IMPLANT
STENT SYNERGY DES 3.5X16 (Permanent Stent) ×2 IMPLANT
STENT SYNERGY DES 3X20 (Permanent Stent) ×2 IMPLANT
TRANSDUCER W/STOPCOCK (MISCELLANEOUS) ×2 IMPLANT
TUBING CIL FLEX 10 FLL-RA (TUBING) ×2 IMPLANT
VALVE GUARDIAN II ~~LOC~~ HEMO (MISCELLANEOUS) ×2 IMPLANT
WIRE ASAHI PROWATER 180CM (WIRE) ×2 IMPLANT
WIRE HI TORQ BMW 190CM (WIRE) IMPLANT
WIRE HI TORQ VERSACORE-J 145CM (WIRE) ×2 IMPLANT
WIRE SAFE-T 1.5MM-J .035X260CM (WIRE) ×2 IMPLANT

## 2016-01-30 NOTE — ED Provider Notes (Signed)
[facility]-EMERGENCY DEPT Provider Note   CSN: [id number] Arrival date & time: 01/30/16  2050  First Provider Contact:   First MD Initiated Contact with Patient 01/30/16 2055      By signing my name below, I, Majel Homer, attest that this documentation has been prepared under the direction and in the presence of Lavera Guise, MD . Electronically Signed: Majel Homer, Scribe. 01/30/2016. 9:07 PM.  History   Chief Complaint Chief Complaint  Patient presents with  . Chest Pain   The history is provided by the patient. No language interpreter was used.   HPI Comments: Hayden Ramirez is a 50 y.o. male who presents to the Emergency Department complaining of gradually worsening, 7/10, chest pain and pressure that began ~1 hour PTA. Pt reports he was outside at a cookout when he began to experience spontaneous diaphoresis, nausea, pressure in his chest and tingling in his left extremities. He states his pressure is exacerbated when talking. He notes he has never experienced similar symptoms before. Pt denies vomiting, hx of HTN, HLD and use of illicit drugs. He reports hx of smoking 1 pack of cigarettes a day and marijuana.   History reviewed. No pertinent past medical history.  There are no active problems to display for this patient.   History reviewed. No pertinent surgical history.   Home Medications    Prior to Admission medications   Not on File    Family History No family history on file.  Social History Social History  Substance Use Topics  . Smoking status: Current Every Day Smoker    Packs/day: 1.00    Types: Cigarettes  . Smokeless tobacco: Never Used  . Alcohol use Yes     Comment: rare   Allergies   Review of patient's allergies indicates not on file.   Review of Systems Review of Systems 10/14 systems reviewed and all are negative for acute change except as noted in the HPI.  Physical Exam Updated Vital Signs BP 118/88   Pulse 78   Temp 97.4 F (36.3 C)  (Oral)   Resp 15   Ht 5\' 11"  (1.803 m)   Wt 175 lb (79.4 kg)   SpO2 100%   BMI 24.41 kg/m   Physical Exam Physical Exam  Nursing note and vitals reviewed. Constitutional: diaphoretic, pale, mild distress Head: Normocephalic and atraumatic.  Mouth/Throat: Oropharynx is clear and moist.  Neck: Normal range of motion. Neck supple.  Cardiovascular: Normal rate and regular rhythm.  no edema. +2 symmetric radial pulse Pulmonary/Chest: Effort normal and breath sounds normal.  Abdominal: Soft. There is no tenderness. There is no rebound and no guarding.  Musculoskeletal: Normal range of motion.  Neurological: Alert, no facial droop, fluent speech, moves all extremities symmetrically Skin: diaphoretic Psychiatric: Cooperative  ED Treatments / Results  Labs (all labs ordered are listed, but only abnormal results are displayed) Labs Reviewed  CBC WITH DIFFERENTIAL/PLATELET - Abnormal; Notable for the following:       Result Value   WBC 14.6 (*)    Neutro Abs 8.0 (*)    Lymphs Abs 5.5 (*)    All other components within normal limits  I-STAT CHEM 8, ED - Abnormal; Notable for the following:    Potassium 3.0 (*)    Chloride 100 (*)    Creatinine, Ser 1.40 (*)    Glucose, Bld 124 (*)    All other components within normal limits  BASIC METABOLIC PANEL  TROPONIN I  PROTIME-INR  I-STAT TROPOININ,  ED    EKG  EKG Interpretation  Date/Time:  Saturday January 30 2016 20:54:51 EDT Ventricular Rate:  81 PR Interval:    QRS Duration: 118 QT Interval:  368 QTC Calculation: 428 R Axis:   85 Text Interpretation:  Sinus arrhythmia Nonspecific intraventricular conduction delay Inferoposterior infarct, acute (RCA) Lateral leads are also involved Probable RV involvement, suggest recording right precordial leads ** ** ACUTE MI / STEMI ** ** Confirmed by Miguelangel Korn MD, Tzivia Oneil ([id number]) on 01/30/2016 9:14:39 PM       Radiology No results found.  Procedures Procedures  DIAGNOSTIC STUDIES:  Oxygen  Saturation is 100% on RA, normal by my interpretation.    COORDINATION OF CARE:  8:59 PM Discussed treatment plan with pt at bedside and pt agreed to plan.   Medications Ordered in ED Medications  aspirin chewable tablet 324 mg (324 mg Oral Given 01/30/16 2103)  heparin injection 4,000 Units (4,000 Units Intravenous Given 01/30/16 2104)  fentaNYL (SUBLIMAZE) injection 50 mcg (50 mcg Intravenous Given 01/30/16 2105)  heparin ADULT infusion 100 units/mL (25000 units/249mL sodium chloride 0.45%) (1,000 Units/hr Intravenous New Bag/Given 01/30/16 2105)   Initial Impression / Assessment and Plan / ED Course  I have reviewed the triage vital signs and the nursing notes.  Pertinent labs & imaging results that were available during my care of the patient were reviewed by me and considered in my medical decision making (see chart for details).  Clinical Course   Presenting with acute inferior STEMI w/ posterior involvement. Code STEMI activated and accepted by Dr. Eldridge Dace. Given full dose aspirin, heparin, and fentanyl. Transferred to [facility].  CRITICAL CARE Performed by: Lavera Guise   Total critical care time: 30 minutes  Critical care time was exclusive of separately billable procedures and treating other patients.  Critical care was necessary to treat or prevent imminent or life-threatening deterioration.  Critical care was time spent personally by me on the following activities: development of treatment plan with patient and/or surrogate as well as nursing, discussions with consultants, evaluation of patient's response to treatment, examination of patient, obtaining history from patient or surrogate, ordering and performing treatments and interventions, ordering and review of laboratory studies, ordering and review of radiographic studies, pulse oximetry and re-evaluation of patient's condition.   I personally performed the services described in this documentation, which was scribed in my  presence. The recorded information has been reviewed and is accurate.   Final Clinical Impressions(s) / ED Diagnoses   Final diagnoses:  ST elevation myocardial infarction (STEMI), unspecified artery [facility])    New Prescriptions New Prescriptions   No medications on file     Lavera Guise, MD 01/30/16 2120

## 2016-01-30 NOTE — ED Triage Notes (Signed)
PAtient reports of sudden onset of central chest pain that bagan 15 minutes ago. Denies radiation.

## 2016-01-30 NOTE — ED Notes (Signed)
Pt wheeled out of dept at 2116 with [facility]

## 2016-01-30 NOTE — Progress Notes (Signed)
ANTICOAGULATION CONSULT NOTE - Initial Consult  Pharmacy Consult for Heparin Indication: chest pain/ACS  Not on File  Patient Measurements: Height: 5\' 11"  (180.3 cm) Weight: 175 lb (79.4 kg) IBW/kg (Calculated) : 75.3 Heparin Dosing Weight: 79.4   Vital Signs: Temp: 97.4 F (36.3 C) (08/05 2055) Temp Source: Oral (08/05 2055) BP: 118/88 (08/05 2100) Pulse Rate: 78 (08/05 2100)  Labs: No results for input(s): HGB, HCT, PLT, APTT, LABPROT, INR, HEPARINUNFRC, HEPRLOWMOCWT, CREATININE, CKTOTAL, CKMB, TROPONINI in the last 72 hours.  CrCl cannot be calculated (No order found.).   Medical History: History reviewed. No pertinent past medical history.  Medications:   (Not in a hospital admission)  Assessment: 50 yo male ED patient, sudden onset central chest pain. Labs pending PTA medications pending, past PTA list no anticogulants Heparin 4000 unit bolus and heparin infusion 1000 units/hour already started by ED  Goal of Therapy:  Heparin level 0.3-0.7 units/ml Monitor platelets by anticoagulation protocol: Yes   Plan:  Continue heparin 1000 units/hour rate as already started in ED Heparin level in 6 hours and daily while on heparin F/U pending labs and PTA medication list CBC daily, monitor platelets, signs of bleeding    Raquel James, Jordyn Hofacker Bennett 01/30/2016,9:12 PM

## 2016-01-31 ENCOUNTER — Inpatient Hospital Stay (HOSPITAL_COMMUNITY): Payer: BLUE CROSS/BLUE SHIELD

## 2016-01-31 DIAGNOSIS — I10 Essential (primary) hypertension: Secondary | ICD-10-CM

## 2016-01-31 DIAGNOSIS — I213 ST elevation (STEMI) myocardial infarction of unspecified site: Secondary | ICD-10-CM

## 2016-01-31 DIAGNOSIS — R683 Clubbing of fingers: Secondary | ICD-10-CM

## 2016-01-31 LAB — TROPONIN I
TROPONIN I: 20.82 ng/mL — AB (ref <0.03)
TROPONIN I: 62.04 ng/mL — AB (ref <0.03)
Troponin I: 64.93 ng/mL (ref <0.03)

## 2016-01-31 LAB — HEPATIC FUNCTION PANEL
ALK PHOS: 49 U/L (ref 38–126)
ALT: 31 U/L (ref 17–63)
AST: 184 U/L — AB (ref 15–41)
Albumin: 3.6 g/dL (ref 3.5–5.0)
Total Bilirubin: 0.6 mg/dL (ref 0.3–1.2)
Total Protein: 6.6 g/dL (ref 6.5–8.1)

## 2016-01-31 LAB — BASIC METABOLIC PANEL
ANION GAP: 11 (ref 5–15)
BUN: 8 mg/dL (ref 6–20)
CALCIUM: 9 mg/dL (ref 8.9–10.3)
CO2: 23 mmol/L (ref 22–32)
Chloride: 101 mmol/L (ref 101–111)
Creatinine, Ser: 0.96 mg/dL (ref 0.61–1.24)
Glucose, Bld: 116 mg/dL — ABNORMAL HIGH (ref 65–99)
POTASSIUM: 3.8 mmol/L (ref 3.5–5.1)
SODIUM: 135 mmol/L (ref 135–145)

## 2016-01-31 LAB — CK TOTAL AND CKMB (NOT AT ARMC)
CK, MB: 255 ng/mL — AB (ref 0.5–5.0)
CK, MB: 385.1 ng/mL — ABNORMAL HIGH (ref 0.5–5.0)
RELATIVE INDEX: 12.6 — AB (ref 0.0–2.5)
Relative Index: 13.1 — ABNORMAL HIGH (ref 0.0–2.5)
Total CK: 2021 U/L — ABNORMAL HIGH (ref 49–397)
Total CK: 2930 U/L — ABNORMAL HIGH (ref 49–397)

## 2016-01-31 LAB — CBC
HEMATOCRIT: 42.1 % (ref 39.0–52.0)
Hemoglobin: 14.5 g/dL (ref 13.0–17.0)
MCH: 30.7 pg (ref 26.0–34.0)
MCHC: 34.4 g/dL (ref 30.0–36.0)
MCV: 89.2 fL (ref 78.0–100.0)
Platelets: 288 10*3/uL (ref 150–400)
RBC: 4.72 MIL/uL (ref 4.22–5.81)
RDW: 14.1 % (ref 11.5–15.5)
WBC: 12.2 10*3/uL — ABNORMAL HIGH (ref 4.0–10.5)

## 2016-01-31 LAB — MRSA PCR SCREENING: MRSA BY PCR: NEGATIVE

## 2016-01-31 MED ORDER — ALUM & MAG HYDROXIDE-SIMETH 200-200-20 MG/5ML PO SUSP
30.0000 mL | Freq: Four times a day (QID) | ORAL | Status: DC | PRN
  Administered 2016-01-31: 30 mL via ORAL
  Filled 2016-01-31 (×2): qty 30

## 2016-01-31 MED ORDER — LOSARTAN POTASSIUM 25 MG PO TABS
25.0000 mg | ORAL_TABLET | Freq: Every day | ORAL | Status: DC
  Administered 2016-01-31 – 2016-02-02 (×3): 25 mg via ORAL
  Filled 2016-01-31 (×3): qty 1

## 2016-01-31 NOTE — H&P (Addendum)
History & Physical    Patient ID: Hayden Ramirez MRN: [id number], DOB/AGE: 48/01/1966   Admit date: 01/30/2016   Primary Physician: No PCP Per Patient Primary Cardiologist: Dr. Eldridge DaceVaranasi History of Present Illness    Hayden Ramirez is a 50 y.o. male with past medical history of tobacco abuse, who is here for STEMI.  Patient reports that while at his family reunion, he first noticed sweating and not feeling well.  His symptoms soon went away within minutes.  He had a second episode of sweating while driving in his car that was soon followed by chest pain.  He described the chest pain as pressure, located in the center of his chest, and severe in intensity.  His chest pain was associated with lightheadedness and shortness of breath.  Because of his chest pain, he had his family member drive him [facility] for further evaluation.    Of note, ECG at outside hospital was noted to be significant for inferolateral and posterior ST-elevations.  Because of STEMI, he was emergently sent to our cardiac catheterization lab for further management.  He has undergone a percutaneous intervention.    Past Medical History    History reviewed. No pertinent past medical history.  History reviewed. No pertinent surgical history.   Allergies  Not on File   Home Medications    Prior to Admission medications   Not on File    Family History    No family history on file.  Social History    Social History   Social History  . Marital status: Single    Spouse name: N/A  . Number of children: N/A  . Years of education: N/A   Occupational History  . Not on file.   Social History Main Topics  . Smoking status: Current Every Day Smoker    Packs/day: 1.00    Types: Cigarettes  . Smokeless tobacco: Never Used  . Alcohol use Yes     Comment: rare  . Drug use:     Types: Marijuana  . Sexual activity: Not on file   Other Topics Concern  . Not on file   Social  History Narrative  . No narrative on file     Review of Systems    All other systems reviewed and are otherwise negative except as noted above.  Physical Exam    Blood pressure 129/88, pulse 84, temperature 97.4 F (36.3 C), temperature source Oral, resp. rate 20, height 5\' 11"  (1.803 m), weight 82.8 kg (182 lb 8.7 oz), SpO2 98 %.  General: Well developed, well nourished,male in no acute distress. Head: Normocephalic, atraumatic, sclera non-icteric, no xanthomas, nares are without discharge. Dentition:  Neck: No carotid bruits. JVD not elevated.  Lungs: Respirations regular and unlabored, without wheezes or rales.  Heart: Regular rate and rhythm. No S3 or S4.  No murmur, no rubs, or gallops appreciated. Abdomen: Soft, non-tender, non-distended with normoactive bowel sounds. No hepatomegaly. No rebound/guarding. No obvious abdominal masses. Msk:  Strength and tone appear normal for age. No joint deformities or effusions. Extremities: Significant for clubbing but no cyanosis. No edema.  Distal pedal pulses are 2+ bilaterally. Neuro: Alert and oriented X 3. Moves all extremities spontaneously. No focal deficits noted. Psych:  Responds to questions appropriately with a normal affect. Skin: No rashes or lesions noted  Labs    Troponin [facility](Point of Care Test)  Recent Labs  01/30/16 2102  TROPIPOC 0.00    Recent Labs  01/30/16 2055  TROPONINI <0.03   Lab Results  Component Value Date   WBC 14.6 (H) 01/30/2016   HGB 16.0 01/30/2016   HCT 47.0 01/30/2016   MCV 89.1 01/30/2016   PLT 297 01/30/2016    Recent Labs Lab 01/30/16 2055 01/30/16 2111  NA 136 140  K 2.9* 3.0*  CL 103 100*  CO2 22  --   BUN 11 10  CREATININE 1.36* 1.40*  CALCIUM 9.4  --   GLUCOSE 129* 124*   No results found for: CHOL, HDL, LDLCALC, TRIG No results found for: DDIMER  No results found for: BNP No results found for: PROBNP  Recent Labs  01/30/16 2055  INR 0.94      Radiology Studies     Dg Chest Portable 1 View  Result Date: 01/30/2016 CLINICAL DATA:  50 year old male with chest pain EXAM: PORTABLE CHEST 1 VIEW COMPARISON:  Chest radiograph dated 07/19/2012 FINDINGS: A stable 1 cm nodular density in at the right lung base likely corresponds to nipple shadow. A pulmonary nodule is not excluded. The lungs are otherwise clear. There is no pleural effusion or pneumothorax. The left costophrenic angle has been excluded from the image. The cardiac silhouette is within normal limits. No acute osseous pathology. IMPRESSION: No active disease. Electronically Signed   By: Elgie Collard M.D.   On: 01/30/2016 21:28    EKG & Cardiac Imaging    EKG:  01/30/16 (21:14):  NSR, lateral/inferior ST-elevations with anterior ST-depression concerning for acute myocardial infarction.    ECHOCARDIOGRAM: Pending.    Assessment & Plan    Active Problems:   Acute MI, inferoposterior wall, initial episode of care [facility])   Acute inferoposterior myocardial infarction [facility])  # STEMI: Patient is status post drug-eluting stent to his Om1 and mid-RCA.  He is now chest pain free and hemodynamically stable.  Repeat ECG is currently pending.  Patient has been loaded with ticagrelor and will start daily dose tomorrow.  Currently on metoprolol, aspirin, and atorvastatin. - Continue guideline directed medical therapy. - Echocardiogram in the morning.   - Arrange for cardiac rehabilitation.   # Tobacco abuse: Patient with known tobacco abuse.  Will arrange for tobacco cessation counseling.    Signed, Judie Grieve, MD 01/31/2016, 12:17 AM    I have examined, prior to cath on 01/30/16, the patient and reviewed assessment and plan and discussed with patient.  Agree with above as stated.  I personally reviewed the ECG and made the decision to bring him emergently for cardiac cath.  Pain starated 15 miniutes prior to arival at the [facility] ED.  He takes no meds but smokes.  No loud murmurs on exam. 1+ right  radial pulse noted.   Two stents placed as noted above. Post procedure:  Blood pressure improved.  Chest pressure is gone.  We spoke about tobacco cessation.  I discussed the findings with family as well.  Mother recently had an MI but she said there were no blockages found.  Will be in the hospital for 2 days most likely.   Lance Muss

## 2016-01-31 NOTE — Progress Notes (Signed)
Upon arrival of patients room pt projectile vomiting, patient denies chest pain. EKG obtained, no changes, prn zofran given, MD Bensimhon notified, transfer discontinued and cycled troponin's ordered. Will continue to assess and monitor patient.

## 2016-01-31 NOTE — Progress Notes (Signed)
Subjective:   50 y/o smoker admitted last night with inferior STEMI. S/p PCI with DES x2. Had torsades in cath lab and received shock x 1  Feels ok. No CP. Sitting in chair. Occasional PVCs.   Echo: EF 40-45% inferior AK. Bubble study negative. No desaturation overnight   Cath: LM: ok LAD: ok LCX: mid 10%    OM-1: 100% s/p DES RCA: mid 90% s/p DES   Intake/Output Summary (Last 24 hours) at 01/31/16 1106 Last data filed at 01/31/16 1000  Gross per 24 hour  Intake           907.34 ml  Output              400 ml  Net           507.34 ml    Current meds: . aspirin  81 mg Oral Daily  . atorvastatin  80 mg Oral q1800  . metoprolol tartrate  12.5 mg Oral BID  . sodium chloride flush  3 mL Intravenous Q12H  . ticagrelor  90 mg Oral BID   Infusions:     Objective:  Blood pressure (!) 134/94, pulse (!) 53, temperature 97.7 F (36.5 C), temperature source Oral, resp. rate 15, height  (1.803 m), weight 82.8 kg (182 lb 8.7 oz), SpO2 98 %. Weight change:   Physical Exam: General:  Well appearing. No resp difficulty HEENT: normal Neck: supple. JVP  7. Carotids 2+ bilat; no bruits. No lymphadenopathy or thryomegaly appreciated. Cor: PMI nondisplaced. Huston Foley regular. No rubs, gallops or murmurs. Lungs: clear Abdomen: soft, nontender, nondistended. No hepatosplenomegaly. No bruits or masses. Good bowel sounds. Extremities: no cyanosis, rash, edema. Severe clubbing Neuro: alert & orientedx3, cranial nerves grossly intact. moves all 4 extremities w/o difficulty. Affect pleasant  Telemetry: Sinus brady 50s with PVCs  Lab Results: Basic Metabolic Panel:  Recent Labs Lab 01/30/16 2055 01/30/16 2111 01/31/16 0541  NA 136 140 135  K 2.9* 3.0* 3.8  CL 103 100* 101  CO2 22  --  23  GLUCOSE 129* 124* 116*  BUN CREATININE 1.36* 1.40* 0.96  CALCIUM 9.4  --  9.0   Liver Function Tests:  Recent Labs Lab 01/31/16 0541  AST 184*  ALT 31  ALKPHOS 49    BILITOT 0.6  PROT 6.6  ALBUMIN 3.6   No results for input(s): LIPASE, AMYLASE in the last 168 hours. No results for input(s): AMMONIA in the last 168 hours. CBC:  Recent Labs Lab 01/30/16 2055 01/30/16 2111 01/31/16 0541  WBC 14.6*  --  12.2*  NEUTROABS 8.0*  --   --   HGB 15.3 16.0 14.5  HCT 44.2 47.0 42.1  MCV 89.1  --  89.2  PLT 297  --  288   Cardiac Enzymes:  Recent Labs Lab 01/30/16 2055 01/31/16 0011 01/31/16 0608  CKTOTAL  --  2,021* 2,930*  CKMB  --  255.0* 385.1*  TROPONINI <0.03 20.82* >65.00*   BNP: Invalid input(s): POCBNP CBG: No results for input(s): GLUCAP in the last 168 hours. Microbiology: No results found for: CULT No results for input(s): CULT, SDES in the last 168 hours.  Imaging: Dg Chest Portable 1 View  Result Date: 01/30/2016 CLINICAL DATA:  50 year old male with chest pain EXAM: PORTABLE CHEST 1 VIEW COMPARISON:  Chest radiograph dated 07/19/2012 FINDINGS: A stable 1 cm nodular density in at the right lung base likely corresponds to nipple shadow. A pulmonary nodule is not excluded. The  lungs are otherwise clear. There is no pleural effusion or pneumothorax. The left costophrenic angle has been excluded from the image. The cardiac silhouette is within normal limits. No acute osseous pathology. IMPRESSION: No active disease. Electronically Signed   By: Elgie CollardArash  Radparvar M.D.   On: 01/30/2016 21:28     ASSESSMENT:  1. Inferior STEMI     --s/p DES x2  01/30/16 2. CAD 3. PVCs 4. Tobacco use 5. AKI - resolved  6. Severe clubbing    --bubble study negative    --no overnight desaturation 7. HTN  PLAN/DISCUSSION:  Doing well post MI s/p DES x 2. Renal function back to baseline. On ASA, Brilinta, statin, b-blocker. Will add losartan for HTN.   He has severe clubbing (lifelong). Echo reviewed personally - no evidence of shunt. No nocturnal desaturation.   Will consult CR. Check exertional sats.   Discussed need for smoking cessation.    Can go to SDU.     LOS: 1 day    Arvilla MeresBensimhon, Raylynne Cubbage, MD 01/31/2016, 11:06 AM

## 2016-01-31 NOTE — Progress Notes (Signed)
*  PRELIMINARY RESULTS* Echocardiogram 2D Echocardiogram with bubble study has been performed.  Jeryl Columbialliott, Jovaughn Wojtaszek 01/31/2016, 10:56 AM

## 2016-02-01 ENCOUNTER — Encounter (HOSPITAL_COMMUNITY): Payer: Self-pay | Admitting: Interventional Cardiology

## 2016-02-01 DIAGNOSIS — E785 Hyperlipidemia, unspecified: Secondary | ICD-10-CM

## 2016-02-01 LAB — BASIC METABOLIC PANEL
ANION GAP: 9 (ref 5–15)
BUN: 7 mg/dL (ref 6–20)
CALCIUM: 9.1 mg/dL (ref 8.9–10.3)
CO2: 23 mmol/L (ref 22–32)
Chloride: 102 mmol/L (ref 101–111)
Creatinine, Ser: 1.01 mg/dL (ref 0.61–1.24)
GFR calc Af Amer: >60 mL/min (ref >60)
GLUCOSE: 121 mg/dL — AB (ref 65–99)
POTASSIUM: 3.7 mmol/L (ref 3.5–5.1)
SODIUM: 134 mmol/L — AB (ref 135–145)

## 2016-02-01 LAB — HEMOGLOBIN A1C
Hgb A1c MFr Bld: 5.9 % — ABNORMAL HIGH (ref 4.8–5.6)
MEAN PLASMA GLUCOSE: 123 mg/dL

## 2016-02-01 LAB — TROPONIN I: TROPONIN I: 48.57 ng/mL — AB (ref <0.03)

## 2016-02-01 MED ORDER — NICOTINE 21 MG/24HR TD PT24
21.0000 mg | MEDICATED_PATCH | Freq: Every day | TRANSDERMAL | Status: DC
  Administered 2016-02-01 – 2016-02-02 (×2): 21 mg via TRANSDERMAL
  Filled 2016-02-01 (×2): qty 1

## 2016-02-01 MED FILL — Amiodarone HCl Inj 150 MG/3ML (50 MG/ML): INTRAVENOUS | Qty: 3 | Status: AC

## 2016-02-01 NOTE — Progress Notes (Signed)
SUBJECTIVE:  *No chest pain; craving tobacco  OBJECTIVE:   Vitals:   Vitals:   02/01/16 0700 02/01/16 0800 02/01/16 0849 02/01/16 0900  BP: 114/79 119/89 114/84 124/85  Pulse: 75 83 76   Resp:      Temp:      TempSrc:      SpO2: (!) 88% 93%  93%  Weight:      Height:       I&O's:   Intake/Output Summary (Last 24 hours) at 02/01/16 1224 Last data filed at 02/01/16 0800  Gross per 24 hour  Intake              465 ml  Output              800 ml  Net             -335 ml   TELEMETRY: Reviewed telemetry pt in NSR:     PHYSICAL EXAM General: Well developed, well nourished, in no acute distress Head:   Normal cephalic and atramatic  Lungs:   Clear bilaterally to auscultation. Heart:   HRRR S1 S2  No JVD.   Abdomen: abdomen soft and non-tender Msk:  Back normal,  Normal strength and tone for age. Extremities:   No edema.  2+ right radial pulse Neuro: Alert and oriented. Psych:  Normal affect, responds appropriately Skin: No rash   LABS: Basic Metabolic Panel:  Recent Labs  16/03/9607/06/17 0541 02/01/16 0327  NA 135 134*  K 3.8 3.7  CL 101 102  CO2 23 23  GLUCOSE 116* 121*  BUN 8 7  CREATININE 0.96 1.01  CALCIUM 9.0 9.1   Liver Function Tests:  Recent Labs  01/31/16 0541  AST 184*  ALT 31  ALKPHOS 49  BILITOT 0.6  PROT 6.6  ALBUMIN 3.6   No results for input(s): LIPASE, AMYLASE in the last 72 hours. CBC:  Recent Labs  01/30/16 2055 01/30/16 2111 01/31/16 0541  WBC 14.6*  --  12.2*  NEUTROABS 8.0*  --   --   HGB 15.3 16.0 14.5  HCT 44.2 47.0 42.1  MCV 89.1  --  89.2  PLT 297  --  288   Cardiac Enzymes:  Recent Labs  01/31/16 0011 01/31/16 0608 01/31/16 1635 01/31/16 2139 02/01/16 0327  CKTOTAL 2,021* 2,930*  --   --   --   CKMB 255.0* 385.1*  --   --   --   TROPONINI 20.82* >65.00* 62.04* 64.93* 48.57*   BNP: Invalid input(s): POCBNP D-Dimer: No results for input(s): DDIMER in the last 72 hours. Hemoglobin A1C:  Recent Labs  01/31/16 0541  HGBA1C 5.9*   Fasting Lipid Panel: No results for input(s): CHOL, HDL, LDLCALC, TRIG, CHOLHDL, LDLDIRECT in the last 72 hours. Thyroid Function Tests: No results for input(s): TSH, T4TOTAL, T3FREE, THYROIDAB in the last 72 hours.  Invalid input(s): FREET3 Anemia Panel: No results for input(s): VITAMINB12, FOLATE, FERRITIN, TIBC, IRON, RETICCTPCT in the last 72 hours. Coag Panel:   Lab Results  Component Value Date   INR 0.94 01/30/2016    RADIOLOGY: Dg Chest Portable 1 View  Result Date: 01/30/2016 CLINICAL DATA:  50 year old male with chest pain EXAM: PORTABLE CHEST 1 VIEW COMPARISON:  Chest radiograph dated 07/19/2012 FINDINGS: A stable 1 cm nodular density in at the right lung base likely corresponds to nipple shadow. A pulmonary nodule is not excluded. The lungs are otherwise clear. There is no pleural effusion or pneumothorax. The left costophrenic angle has  been excluded from the image. The cardiac silhouette is within normal limits. No acute osseous pathology. IMPRESSION: No active disease. Electronically Signed   By: Elgie Collard M.D.   On: 01/30/2016 21:28      ASSESSMENT: Roosvelt Harps:    1) s/p inferoposterior MI, treated with OM1 stent and RCA stent.  No angina.  Start nicotine patch.  Plan to transfer to tele and possible d/c in AM.  Continue DAPT, statin, ARB, beta blocker.  Corky Crafts, MD  02/01/2016  12:24 PM

## 2016-02-01 NOTE — Progress Notes (Signed)
CARDIAC REHAB PHASE I   PRE:  Rate/Rhythm: 73 SR  BP:  Supine: 114/84  Sitting:   Standing:    SaO2: 95%RA  MODE:  Ambulation: 350 ft   POST:  Rate/Rhythm: 86 SR  BP:  Supine:   Sitting: 124/85  Standing:    SaO2: 95%RA 0845-0948 Pt walked 350 ft on RA with steady gait. No CP. MI education completed with pt who voiced understanding. Stressed importance of brilinta with stent. Needs to see case manager for brilinta card. Reviewed NTG use, MI restrictions, not smoking, risk factors, heart healthy diet, ex ed and CRP 2. Gave pt fake cigarette and smoking cessation handout. He smokes cigars. Has never tried to quit before. Discussed CRP 2 and [facility]. To recliner after walk with call bell. Tolerated walk well.   Luetta Nuttingharlene Harrison Paulson, RN BSN  02/01/2016 9:45 AM

## 2016-02-01 NOTE — Care Management Note (Signed)
Case Management Note  Patient Details  Name: [name] MRN: [id number] Date of Birth: 09/20/1965  Subjective/Objective:       Adm w mi             Action/Plan:  lives w fam, has ins but never had to use.   Expected Discharge Date:                  Expected Discharge Plan:  Home/Self Care  In-House Referral:     Discharge planning Services  CM Consult, Medication Assistance  Post Acute Care Choice:    Choice offered to:     DME Arranged:    DME Agency:     HH Arranged:    HH Agency:     Status of Service:     If discussed at [employer] Meetings, dates discussed:    Additional Comments: gave pt 30day free and copay card for brilinta. Has ins for meds. Hanley Haysowell, Anaija Wissink T, RN 02/01/2016, 10:06 AM

## 2016-02-01 NOTE — Progress Notes (Signed)
Patient transferred to 2W08. Patient transferred with all belongings in wheelchair and report given to GrenadaBrittany, Charity fundraiserN.

## 2016-02-02 ENCOUNTER — Telehealth: Payer: Self-pay | Admitting: Cardiology

## 2016-02-02 ENCOUNTER — Encounter (HOSPITAL_COMMUNITY): Payer: Self-pay | Admitting: Student

## 2016-02-02 DIAGNOSIS — E785 Hyperlipidemia, unspecified: Secondary | ICD-10-CM

## 2016-02-02 DIAGNOSIS — Z720 Tobacco use: Secondary | ICD-10-CM

## 2016-02-02 LAB — LIPID PANEL
CHOL/HDL RATIO: 4.3 ratio
Cholesterol: 130 mg/dL (ref 0–200)
HDL: 30 mg/dL — AB (ref >40)
LDL CALC: 81 mg/dL (ref 0–99)
Triglycerides: 97 mg/dL (ref <150)
VLDL: 19 mg/dL (ref 0–40)

## 2016-02-02 MED ORDER — METOPROLOL TARTRATE 25 MG PO TABS: 12.5000 mg | ORAL_TABLET | Freq: Two times a day (BID) | ORAL | 3 refills | Status: DC

## 2016-02-02 MED ORDER — ATORVASTATIN CALCIUM 80 MG PO TABS: 80.0000 mg | ORAL_TABLET | Freq: Every day | ORAL | 3 refills | Status: DC

## 2016-02-02 MED ORDER — TICAGRELOR 90 MG PO TABS: 90.0000 mg | ORAL_TABLET | Freq: Two times a day (BID) | ORAL | 0 refills | Status: DC

## 2016-02-02 MED ORDER — ASPIRIN 81 MG PO CHEW: 81.0000 mg | CHEWABLE_TABLET | Freq: Every day | ORAL | Status: DC

## 2016-02-02 MED ORDER — TICAGRELOR 90 MG PO TABS: 90.0000 mg | ORAL_TABLET | Freq: Two times a day (BID) | ORAL | 3 refills | Status: DC

## 2016-02-02 MED ORDER — NITROGLYCERIN 0.4 MG SL SUBL: 0.4000 mg | SUBLINGUAL_TABLET | SUBLINGUAL | 3 refills | Status: DC | PRN

## 2016-02-02 MED ORDER — NITROGLYCERIN 0.4 MG SL SUBL: 0.4000 mg | SUBLINGUAL_TABLET | SUBLINGUAL | Status: DC | PRN

## 2016-02-02 MED ORDER — LOSARTAN POTASSIUM 25 MG PO TABS: 25.0000 mg | ORAL_TABLET | Freq: Every day | ORAL | 3 refills | Status: DC

## 2016-02-02 NOTE — Progress Notes (Signed)
Hospital Problem List     Principal Problem:   Acute inferoposterior myocardial infarction [facility]) Active Problems:   Acute MI, inferoposterior wall, initial episode of care (HCC)   Tobacco abuse   Dyslipidemia, goal LDL below 70    Patient Profile:   Primary Cardiologist: Dr. Eldridge Dace  50 yo male w/ PMH of tobacco use who presented to an [facility] on 01/30/2016 reporting chest pain, EKG with inferolateral and posterior ST elevations and was transferred to [facility] as a CODE STEMI.   Subjective   Ambulated over 350 ft with cardiac rehab yesterday. Reports a mild right-sided chest soreness this AM. Somewhat worse with movement, completely different from his presenting symptoms.   Inpatient Medications    . aspirin  81 mg Oral Daily  . atorvastatin  80 mg Oral q1800  . losartan  25 mg Oral Daily  . metoprolol tartrate  12.5 mg Oral BID  . nicotine  21 mg Transdermal Daily  . sodium chloride flush  3 mL Intravenous Q12H  . ticagrelor  90 mg Oral BID    Vital Signs    Vitals:   02/01/16 1927 02/01/16 2030 02/01/16 2310 02/02/16 0516  BP:  112/79 115/77 104/77  Pulse:  84 81 73  Resp:  17  20  Temp: 98.4 F (36.9 C) 98.3 F (36.8 C)  98 F (36.7 C)  TempSrc: Oral Oral  Oral  SpO2:  97%  96%  Weight:      Height:        Intake/Output Summary (Last 24 hours) at 02/02/16 0645 Last data filed at 02/01/16 1800  Gross per 24 hour  Intake              800 ml  Output              400 ml  Net              400 ml   Filed Weights   01/30/16 2055 01/30/16 2330  Weight: 175 lb (79.4 kg) 182 lb 8.7 oz (82.8 kg)    Physical Exam    General: Well developed, well nourished, male appearing in no acute distress. Head: Normocephalic, atraumatic.  Neck: Supple without bruits, JVD not elevated. Lungs:  Resp regular and unlabored, CTA without wheezing or rales. Heart: RRR, S1, S2, no S3, S4, or murmur; no rub. Abdomen: Soft, non-tender, non-distended with normoactive bowel  sounds. No hepatomegaly. No rebound/guarding. No obvious abdominal masses. Extremities: No clubbing, cyanosis, or edema. Distal pedal pulses are 2+ bilaterally. Right radial cath site without ecchymosis or evidence of a hematoma.  Neuro: Alert and oriented X 3. Moves all extremities spontaneously. Psych: Normal affect.  Labs    CBC  Recent Labs  01/30/16 2055 01/30/16 2111 01/31/16 0541  WBC 14.6*  --  12.2*  NEUTROABS 8.0*  --   --   HGB 15.3 16.0 14.5  HCT 44.2 47.0 42.1  MCV 89.1  --  89.2  PLT 297  --  288   Basic Metabolic Panel  Recent Labs  01/31/16 0541 02/01/16 0327  NA 135 134*  K 3.8 3.7  CL 101 102  CO2 23 23  GLUCOSE 116* 121*  BUN 8 7  CREATININE 0.96 1.01  CALCIUM 9.0 9.1   Liver Function Tests  Recent Labs  01/31/16 0541  AST 184*  ALT 31  ALKPHOS 49  BILITOT 0.6  PROT 6.6  ALBUMIN 3.6   No results for input(s): LIPASE, AMYLASE  in the last 72 hours. Cardiac Enzymes  Recent Labs  01/31/16 0011 01/31/16 0608 01/31/16 1635 01/31/16 2139 02/01/16 0327  CKTOTAL 2,021* 2,930*  --   --   --   CKMB 255.0* 385.1*  --   --   --   TROPONINI 20.82* >65.00* 62.04* 64.93* 48.57*   BNP Invalid input(s): POCBNP D-Dimer No results for input(s): DDIMER in the last 72 hours. Hemoglobin A1C  Recent Labs  01/31/16 0541  HGBA1C 5.9*   Fasting Lipid Panel  Recent Labs  02/02/16 0243  CHOL 130  HDL 30*  LDLCALC 81  TRIG 97  CHOLHDL 4.3   Thyroid Function Tests No results for input(s): TSH, T4TOTAL, T3FREE, THYROIDAB in the last 72 hours.  Invalid input(s): FREET3  Telemetry    NSR, HR in 70's - 80's.   ECG    No new tracings.    Cardiac Studies and Radiology    Dg Chest Portable 1 View  Result Date: 01/30/2016 CLINICAL DATA:  50 year old male with chest pain EXAM: PORTABLE CHEST 1 VIEW COMPARISON:  Chest radiograph dated 07/19/2012 FINDINGS: A stable 1 cm nodular density in at the right lung base likely corresponds to nipple  shadow. A pulmonary nodule is not excluded. The lungs are otherwise clear. There is no pleural effusion or pneumothorax. The left costophrenic angle has been excluded from the image. The cardiac silhouette is within normal limits. No acute osseous pathology. IMPRESSION: No active disease. Electronically Signed   By: Elgie CollardArash  Radparvar M.D.   On: 01/30/2016 21:28    Cardiac Catheterization: 01/30/2016   1st Mrg lesion, 100 %stenosed-culprit. A STENT SYNERGY DES 3.5X16 drug eluting stent, post dilated to 3.8 mm was successfully placed.  Post intervention, there is a 0% residual stenosis.  Mid RCA lesion, 90 %stenosed. A STENT SYNERGY DES 3X20 drug eluting stent, post dilated to 3.3 mm was successfully placed.  Post intervention, there is a 0% residual stenosis.  LV end diastolic pressure is mildly elevated.  There is no aortic valve stenosis.   Continue dual antiplatelet therapy for at least a year. He'll need aggressive secondary prevention including smoking cessation.   Continue IV tirofiban for 3 hours.  Check echocardiogram to evaluate left ventricular function. I anticipate he'll be in the hospital for 2 days. Long-term, he may want a follow-up in the [place] office.  Echocardiogram: 01/31/2016 Study Conclusions  - Left ventricle: The cavity size was normal. Wall thickness was   increased in a pattern of mild LVH. Systolic function was mildly   to moderately reduced. The estimated ejection fraction was in the   range of 40% to 45%. There is akinesis of the inferolateral   myocardium. Features are consistent with a pseudonormal left   ventricular filling pattern, with concomitant abnormal relaxation   and increased filling pressure (grade 2 diastolic dysfunction).  Impressions:  - Akinesis of the inferior lateral wall with overall mild to   moderate reduction in LV function; grade 2 diastolic dysfunction;   mild LVH.  Assessment & Plan    1. Acute Inferoposterior wall  STEMI/ Ischemic Cardiomyopathy - presented to an [facility] on 01/30/2016 reporting chest pain, EKG with inferolateral and posterior ST elevations and was [facility] as a CODE STEMI.  - cardiac catheterization showed 100% stenosis of 1st Mrg lesion. DES successfully placed with a 0% residual stenosis. Mid RCA with 90% stenosis and DES placed as well with 0% residual stenosis. - echo this admission shows EF of 40-45% with  AK of the inferolateral myocardium.  - continue DAPT with ASA and Brilinta (given 30-day card by Case Management). Continue BB, statin, and ARB.   2. Tobacco Use - cessation advised.  3. Dyslipidemia, goal LDL < 70. - Lipid Panel with HDL 30, LDL 81. - continue high-intensity statin.   Currently lives in [place] and wishes to follow-up here. Will arrange 7-day TCM appointment. Appears stable for discharge later today.   Signed, Ellsworth Lennox , PA-C 6:45 AM 02/02/2016 Pager: [phone]   I have examined the patient and reviewed assessment and plan and discussed with patient.  Agree with above as stated.  Doing well. Stressed importance of DAPT.  He will use nicotine patches for smoking cessation. Discharge today.  Lance Muss

## 2016-02-02 NOTE — Progress Notes (Signed)
Pt discharged home. AVS discussed with pt who voiced understanding

## 2016-02-02 NOTE — Telephone Encounter (Signed)
New message       TCM appt on 02-08-16 with Boyce MediciBrittany Simmons per Randall AnBrittany Strader

## 2016-02-02 NOTE — Progress Notes (Signed)
CARDIAC REHAB PHASE I   PRE:  Rate/Rhythm: 89 SR  BP:  Supine:   Sitting: 102/78  Standing:    SaO2:   MODE:  Ambulation: 780 ft   POST:  Rate/Rhythm: 95 SR  BP:  Supine:   Sitting: 100/76  Standing:    SaO2:  0926-0950 Pt walked 780 ft with steady gait. Tolerated well. No CP. Has brilinta card. No questions re ed done yesterday.    Luetta Nuttingharlene Sajad Glander, RN BSN  02/02/2016 9:45 AM

## 2016-02-02 NOTE — Discharge Summary (Signed)
Discharge Summary    Patient ID: Hayden Ramirez,  MRN: [id number], DOB/AGE: 09/10/1965 50 y.o.  Admit date: 01/30/2016 Discharge date: 02/02/2016  Primary Care Provider: No PCP Per Patient Primary Cardiologist: Dr. Eldridge Dace  Discharge Diagnoses    Principal Problem:   Acute inferoposterior myocardial infarction [facility]) Active Problems:   Acute MI, inferoposterior wall, initial episode of care [facility])   Tobacco abuse   Dyslipidemia, goal LDL below 70    History of Present Illness     Hayden Ramirez is a 50 y.o. male with past medical history of tobacco use and no prior cardiac history who presented to [facility] on 01/30/2016 for evaluation of chest pain.  Reported first noticing his symptoms earlier that day while at a family reunion. Developed an episode of diaphoresis and general fatigue, but later developed chest discomfort associated with dyspnea and lightheadedness. A family member drove him to the hospital and his initial EKG showed ST elevation in the inferolateral and posterior leads. Therefore, CODE STEMI was activated and he was transported to [facility] for an emergent cardiac catheterization.   Hospital Course     Consultants: None  His cardiac catheterization showed 100% stenosis of 1st Mrg lesion (DES successfully placed with a 0% residual stenosis) and 90% stenosis of the Mid RCA (DES placed as well with 0% residual stenosis). He did develop Torsades while in the cath lab which resolved with DCCV x1. He was started on DAPT with ASA and Brilinta along with high-intensity statin and a BB.  The following morning, he denied any repeat episodes of chest discomfort. Cyclic troponin values peaked at > 65.00, but were trending down to 48.57 at last check. An echocardiogram was obtained which showed an EF of 40-45% with inferior AK. With his reduced EF, Losartan was added to his medication regimen. He did experience episodes of vomiting later that day and  remained in the step-down unit.  On 02/01/2016, he reported feeling significantly better. Acute illness resolved. He ambulated over 350 ft with cardiac rehab without any anginal symptoms. A lipid panel was checked which showed an HDL 30 and LDL 81. He will continue on high-intensity statin therapy with a goal LDL less than 70.  The following morning, he reported having a mild right chest pressure which was worse with movement and significantly different from his presenting symptoms. He was encouraged to ambulate with cardiac rehab again and ambulated over 780 ft with no repeat symptoms.   He was last examined by Dr. Eldridge Dace and deemed stable for discharge. Smoking cessation was advised. He was given a printed Rx to use with his Brilinta co-pay card along with refills of his newly started cardiac medications (as listed below). A 7-day TCM appointment has been arranged. He was discharged home in stable condition. _____________  Discharge Vitals Blood pressure 110/76, pulse 78, temperature 98.5 F (36.9 C), temperature source Oral, resp. rate 20, height  (1.803 m), weight 182 lb 8.7 oz (82.8 kg), SpO2 98 %.  Filed Weights   01/30/16 2055 01/30/16 2330  Weight: 175 lb (79.4 kg) 182 lb 8.7 oz (82.8 kg)    Labs & Radiologic Studies     CBC  Recent Labs  01/30/16 2055 01/30/16 2111 01/31/16 0541  WBC 14.6*  --  12.2*  NEUTROABS 8.0*  --   --   HGB 15.3 16.0 14.5  HCT 44.2 47.0 42.1  MCV 89.1  --  89.2  PLT 297  --  288   Basic Metabolic Panel  Recent Labs  01/31/16 0541 02/01/16 0327  NA 135 134*  K 3.8 3.7  CL 101 102  CO2 23 23  GLUCOSE 116* 121*  BUN 8 7  CREATININE 0.96 1.01  CALCIUM 9.0 9.1   Liver Function Tests  Recent Labs  01/31/16 0541  AST 184*  ALT 31  ALKPHOS 49  BILITOT 0.6  PROT 6.6  ALBUMIN 3.6   No results for input(s): LIPASE, AMYLASE in the last 72 hours. Cardiac Enzymes  Recent Labs  01/31/16 0011 01/31/16 0608 01/31/16 1635  01/31/16 2139 02/01/16 0327  CKTOTAL 2,021* 2,930*  --   --   --   CKMB 255.0* 385.1*  --   --   --   TROPONINI 20.82* >65.00* 62.04* 64.93* 48.57*   BNP Invalid input(s): POCBNP D-Dimer No results for input(s): DDIMER in the last 72 hours. Hemoglobin A1C  Recent Labs  01/31/16 0541  HGBA1C 5.9*   Fasting Lipid Panel  Recent Labs  02/02/16 0243  CHOL 130  HDL 30*  LDLCALC 81  TRIG 97  CHOLHDL 4.3   Thyroid Function Tests No results for input(s): TSH, T4TOTAL, T3FREE, THYROIDAB in the last 72 hours.  Invalid input(s): FREET3  Dg Chest Portable 1 View  Result Date: 01/30/2016 CLINICAL DATA:  50 year old male with chest pain EXAM: PORTABLE CHEST 1 VIEW COMPARISON:  Chest radiograph dated 07/19/2012 FINDINGS: A stable 1 cm nodular density in at the right lung base likely corresponds to nipple shadow. A pulmonary nodule is not excluded. The lungs are otherwise clear. There is no pleural effusion or pneumothorax. The left costophrenic angle has been excluded from the image. The cardiac silhouette is within normal limits. No acute osseous pathology. IMPRESSION: No active disease. Electronically Signed   By: Elgie CollardArash  Radparvar M.D.   On: 01/30/2016 21:28     Diagnostic Studies/Procedures     Cardiac Catheterization: 01/30/2016  1st Mrg lesion, 100 %stenosed-culprit. A STENT SYNERGY DES 3.5X16 drug eluting stent, post dilated to 3.8 mm was successfully placed.  Post intervention, there is a 0% residual stenosis.  Mid RCA lesion, 90 %stenosed. A STENT SYNERGY DES 3X20 drug eluting stent, post dilated to 3.3 mm was successfully placed.  Post intervention, there is a 0% residual stenosis.  LV end diastolic pressure is mildly elevated.  There is no aortic valve stenosis.   Continue dual antiplatelet therapy for at least a year. He'll need aggressive secondary prevention including smoking cessation.   Continue IV tirofiban for 3 hours.  Check echocardiogram to evaluate left  ventricular function. I anticipate he'll be in the hospital for 2 days. Long-term, he may want a follow-up in the [place] office.   Echocardiogram: 01/31/2016 Study Conclusions  - Left ventricle: The cavity size was normal. Wall thickness was   increased in a pattern of mild LVH. Systolic function was mildly   to moderately reduced. The estimated ejection fraction was in the   range of 40% to 45%. There is akinesis of the inferolateral   myocardium. Features are consistent with a pseudonormal left   ventricular filling pattern, with concomitant abnormal relaxation   and increased filling pressure (grade 2 diastolic dysfunction).  Impressions:  - Akinesis of the inferior lateral wall with overall mild to   moderate reduction in LV function; grade 2 diastolic dysfunction;   mild LVH.    Disposition   Pt is being discharged home today in good condition.  Follow-up Plans & Appointments  Follow-up Information    Robbie Lis, PA-C Follow up on 02/08/2016.   Specialties:  Cardiology, Radiology Why:  Cardiology Hospital Follow-Up on 02/08/2016 at 11:00AM.  Contact information: [address] [place] [address] [phone]          Discharge Instructions    Amb Referral to Cardiac Rehabilitation    Complete by:  As directed   Diagnosis:   STEMI Coronary Stents     Diet - low sodium heart healthy    Complete by:  As directed   Discharge instructions    Complete by:  As directed   PLEASE REMEMBER TO BRING ALL OF YOUR MEDICATIONS TO EACH OF YOUR FOLLOW-UP OFFICE VISITS.  PLEASE ATTEND ALL SCHEDULED FOLLOW-UP APPOINTMENTS.   Activity: Increase activity slowly as tolerated. You may shower, but no soaking baths (or swimming) for 1 week. You may not return to work until cleared by your cardiologist. No lifting over 10 lbs for 4 weeks. No sexual activity for 4 weeks.   Wound Care: You may wash cath site gently with soap and water. Keep cath site clean and  dry. If you notice pain, swelling, bleeding or pus at your cath site, please call [phone].   Increase activity slowly    Complete by:  As directed      Discharge Medications     Medication List    STOP taking these medications   OVER THE COUNTER MEDICATION     TAKE these medications   aspirin 81 MG chewable tablet Chew 1 tablet (81 mg total) by mouth daily.   atorvastatin 80 MG tablet Commonly known as:  LIPITOR Take 1 tablet (80 mg total) by mouth daily at 6 PM.   losartan 25 MG tablet Commonly known as:  COZAAR Take 1 tablet (25 mg total) by mouth daily.   metoprolol tartrate 25 MG tablet Commonly known as:  LOPRESSOR Take 0.5 tablets (12.5 mg total) by mouth 2 (two) times daily.   nitroGLYCERIN 0.4 MG SL tablet Commonly known as:  NITROSTAT Place 1 tablet (0.4 mg total) under the tongue every 5 (five) minutes as needed for chest pain.   ticagrelor 90 MG Tabs tablet Commonly known as:  BRILINTA Take 1 tablet (90 mg total) by mouth 2 (two) times daily.       Aspirin prescribed at discharge?  Yes High Intensity Statin Prescribed? (Lipitor 40-80mg  or Crestor 20-40mg ): Yes Beta Blocker Prescribed? Yes For EF 40% or less, Was ACEI/ARB Prescribed? Yes ADP Receptor Inhibitor Prescribed? (i.e. Plavix etc.-Includes Medically Managed Patients): Yes For EF <40%, Aldosterone Inhibitor Prescribed? No: N/A, EF 40-45% Was EF assessed during THIS hospitalization? Yes - Cath and Echo Was Cardiac Rehab II ordered? (Included Medically managed Patients): Yes   Allergies Not on File   Outstanding Labs/Studies   None  Duration of Discharge Encounter   Greater than 30 minutes including physician time.  Signed, Ellsworth Lennox, PA-C 02/02/2016, 1:43 PM   I have examined the patient and reviewed assessment and plan and discussed with patient.  Agree with above as stated.  Stressed DAPT, smoking cessation.  Cardiac rehab recommended.  Has some right chest soreness,  likely related to defibrillation, different from anginal pain. Lipids likely falsely low at time of MI. Continue statin.  May be able to reduce dose in the future. Plan discharge today.  Lance Muss

## 2016-02-03 ENCOUNTER — Encounter: Payer: Self-pay | Admitting: Cardiology

## 2016-02-03 NOTE — Telephone Encounter (Signed)
Left message to call back on mobile number. No answer-unable to leave message on home number.

## 2016-02-03 NOTE — Telephone Encounter (Signed)
TCM call transferred in to triage; returning phone call.  Patient contacted regarding [facility] on 02/02/16.  Patient understands to follow up with provider Robbie LisBrittainy Simmons, APP on 02/08/16 at 11:00 am at [facility].  Patient understands discharge instructions? Yes  Patient understands medications and regimen? Yes   Patient understands to bring all medications to this visit? Yes  Pt sent My Chart message to Brittainy asking if he can take a dietary supplement with testosterone for muscle building.  Advised do not take any at this time, bring this bottle along with other bottles to his visit on Monday and discuss with APP.  Cath site is clean, dry, flat, scabbed.  Increasing activity as tolerated.

## 2016-02-08 ENCOUNTER — Encounter: Payer: Self-pay | Admitting: Cardiology

## 2016-02-08 ENCOUNTER — Encounter: Payer: Self-pay | Admitting: *Deleted

## 2016-02-08 ENCOUNTER — Ambulatory Visit (INDEPENDENT_AMBULATORY_CARE_PROVIDER_SITE_OTHER): Payer: BLUE CROSS/BLUE SHIELD | Admitting: Cardiology

## 2016-02-08 VITALS — BP 100/68 | HR 58 | Ht 71.0 in | Wt 176.4 lb

## 2016-02-08 DIAGNOSIS — I2510 Atherosclerotic heart disease of native coronary artery without angina pectoris: Secondary | ICD-10-CM | POA: Diagnosis not present

## 2016-02-08 DIAGNOSIS — Z9861 Coronary angioplasty status: Secondary | ICD-10-CM | POA: Diagnosis not present

## 2016-02-08 NOTE — Patient Instructions (Addendum)
Medication Instructions:  Your physician recommends that you continue on your current medications as directed. Please refer to the Current Medication list given to you today.  If you need a refill on your cardiac medications before your next appointment, please call your pharmacy.  Labwork: NONE ORDER TODAY'   Testing/Procedures: . YOU HAVE BEEN REFERRED FOR CARDIAC REHAB PHASE TWO SOME ONE WILL CONTACT YOU FOR FURTHER INSTRUCTIONS FROM CARDIAC REHAB LAB   Follow-Up:  6 TO 8 WEEK  WITH DR Eldridge DaceVARANASI   Any Other Special Instructions Will Be Listed Below (If Applicable).  YOUR WORK  HAS BEEN INCLUDED WITH YOUR OFFICE VISIT TODAY ON A SEPARATE DOCUMENT                                                                                                                                           Smoking Cessation, Tips for Success  If you are ready to quit smoking, congratulations! You have chosen to help yourself be healthier. Cigarettes bring nicotine, tar, carbon monoxide, and other irritants into your body. Your lungs, heart, and blood vessels will be able to work better without these poisons. There are many different ways to quit smoking. Nicotine gum, nicotine patches, a nicotine inhaler, or nicotine nasal spray can help with physical craving. Hypnosis, support groups, and medicines help break the habit of smoking.  WHAT THINGS CAN I DO TO MAKE QUITTING EASIER?  Here are some tips to help you quit for good:  Pick a date when you will quit smoking completely. Tell all of your friends and family about your plan to quit on that date.  Do not try to slowly cut down on the number of cigarettes you are smoking. Pick a quit date and quit smoking completely starting on that day.  Throw away all cigarettes.   Clean and remove all ashtrays from your home, work, and car.  On a card, write down your reasons for quitting. Carry the card with you and read it when you get the urge to smoke.  Cleanse  your body of nicotine. Drink enough water and fluids to keep your urine clear or pale yellow. Do this after quitting to flush the nicotine from your body.  Learn to predict your moods. Do not let a bad situation be your excuse to have a cigarette. Some situations in your life might tempt you into wanting a cigarette.  Never have "just one" cigarette. It leads to wanting another and another. Remind yourself of your decision to quit.  Change habits associated with smoking. If you smoked while driving or when feeling stressed, try other activities to replace smoking. Stand up when drinking your coffee. Brush your teeth after eating. Sit in a different chair when you read the paper. Avoid alcohol while trying to quit, and try to drink fewer caffeinated beverages. Alcohol and caffeine may urge you to smoke.  Avoid  foods and drinks that can trigger a desire to smoke, such as sugary or spicy foods and alcohol.  Ask people who smoke not to smoke around you.  Have something planned to do right after eating or having a cup of coffee. For example, plan to take a walk or exercise.  Try a relaxation exercise to calm you down and decrease your stress. Remember, you may be tense and nervous for the first 2 weeks after you quit, but this will pass.  Find new activities to keep your hands busy. Play with a pen, coin, or rubber band. Doodle or draw things on paper.  Brush your teeth right after eating. This will help cut down on the craving for the taste of tobacco after meals. You can also try mouthwash.   Use oral substitutes in place of cigarettes. Try using lemon drops, carrots, cinnamon sticks, or chewing gum. Keep them handy so they are available when you have the urge to smoke.  When you have the urge to smoke, try deep breathing.  Designate your home as a nonsmoking area.  If you are a heavy smoker, ask your health care provider about a prescription for nicotine chewing gum. It can ease your  withdrawal from nicotine.  Reward yourself. Set aside the cigarette money you save and buy yourself something nice.  Look for support from others. Join a support group or smoking cessation program. Ask someone at home or at work to help you with your plan to quit smoking.  Always ask yourself, "Do I need this cigarette or is this just a reflex?" Tell yourself, "Today, I choose not to smoke," or "I do not want to smoke." You are reminding yourself of your decision to quit.  Do not replace cigarette smoking with electronic cigarettes (commonly called e-cigarettes). The safety of e-cigarettes is unknown, and some may contain harmful chemicals.  If you relapse, do not give up! Plan ahead and think about what you will do the next time you get the urge to smoke. HOW WILL I FEEL WHEN I QUIT SMOKING? You may have symptoms of withdrawal because your body is used to nicotine (the addictive substance in cigarettes). You may crave cigarettes, be irritable, feel very hungry, cough often, get headaches, or have difficulty concentrating. The withdrawal symptoms are only temporary. They are strongest when you first quit but will go away within 10-14 days. When withdrawal symptoms occur, stay in control. Think about your reasons for quitting. Remind yourself that these are signs that your body is healing and getting used to being without cigarettes. Remember that withdrawal symptoms are easier to treat than the major diseases that smoking can cause.  Even after the withdrawal is over, expect periodic urges to smoke. However, these cravings are generally short lived and will go away whether you smoke or not. Do not smoke! WHAT RESOURCES ARE AVAILABLE TO HELP ME QUIT SMOKING? Your health care provider can direct you to community resources or hospitals for support, which may include:  Group support.  Education.  Hypnosis.  Therapy.   This information is not intended to replace advice given to you by your health  care provider. Make sure you discuss any questions you have with your health care provider.   Document Released: 03/11/2004 Document Revised: 07/04/2014 Document Reviewed: 11/29/2012 Elsevier Interactive Patient Education [employer]2016 [employer] Inc[employer]

## 2016-02-08 NOTE — Progress Notes (Signed)
02/08/2016 Hayden Ramirez   11/07/1965  [id number]  Primary Physician No PCP Per Patient Primary Cardiologist: Dr. Eldridge DaceVaranasi   Reason for Visit/CC: Post hospital follow-up for CAD, status post STEMI  HPI:  The patient is a 50 year old African-American male smoker, who presents to clinic today for post hospital follow-up after recent diagnosis of CAD. He initially presented to [facility] on 01/30/2016 with chest pain and EKG which showed ST elevations. Code STEMI was activated and [facility] was [facility] for emergent cardiac cath catheterization. Procedure was performed by Dr. Eldridge DaceVaranasi. He was found to have 100% stenosis of 1st Mrg lesion (DES successfully placed with a 0% residual stenosis) and 90% stenosis of the Mid RCA (DES placed as well with 0% residual stenosis). He did develop Torsades while in the cath lab which resolved with DCCV x1. He was started on DAPT with ASA and Brilinta along with high-intensity statin and a BB.  The following morning, he denied any repeat episodes of chest discomfort. Cyclic troponin values peaked at > 65.00, but were trending down to 48.57 at last check. An echocardiogram was obtained which showed an EF of 40-45% with inferior AK. With his reduced EF, Losartan was added to his medication regimen. A lipid panel was checked which showed an HDL 30 and LDL 81. He was started on high-intensity statin therapy with a goal LDL less than 70. Globin A1c suggested prediabetes with the level of 5.9.  He presents to clinic today for follow-up. He reports that he has done well since discharge. He denies any recurrent chest pain. No dyspnea. He has been fully compliant with medications, including dual antiplatelet therapy. He denies any abnormal bleeding with aspirin and Brilinta. No myalgias/arthralgias with Lipitor. Blood pressure is well-controlled at 100/68. Pulse rate is 58. EKG shows sinus bradycardia without any ischemia. He denies any  symptoms of lightheadedness or dizziness. Unfortunately he continues to smoke but he reports that he has cut back significantly. He is interested in trying nicotine patches. He is also interested in cardiac rehabilitation. He denies any symptoms of CHF including no dyspnea, orthopnea, PND nor lower extremity edema. He has adjusted his diet and has reduced sodium intake.   Current Outpatient Prescriptions  Medication Sig Dispense Refill  . aspirin 81 MG chewable tablet Chew 1 tablet (81 mg total) by mouth daily.    Marland Kitchen. atorvastatin (LIPITOR) 80 MG tablet Take 1 tablet (80 mg total) by mouth daily at 6 PM. 90 tablet 3  . losartan (COZAAR) 25 MG tablet Take 1 tablet (25 mg total) by mouth daily. 90 tablet 3  . metoprolol tartrate (LOPRESSOR) 25 MG tablet Take 0.5 tablets (12.5 mg total) by mouth 2 (two) times daily. 90 tablet 3  . nitroGLYCERIN (NITROSTAT) 0.4 MG SL tablet Place 1 tablet (0.4 mg total) under the tongue every 5 (five) minutes as needed for chest pain. 25 tablet 3  . OVER THE COUNTER MEDICATION Take 2 capsules by mouth daily. Platinum XT - Testosterone Booster    . ticagrelor (BRILINTA) 90 MG TABS tablet Take 1 tablet (90 mg total) by mouth 2 (two) times daily. 180 tablet 3   No current facility-administered medications for this visit.     No Known Allergies  Social History   Social History  . Marital status: Single    Spouse name: N/A  . Number of children: N/A  . Years of education: N/A   Occupational History  . Not on file.  Social History Main Topics  . Smoking status: Light Tobacco Smoker    Packs/day: 1.00    Types: Cigarettes  . Smokeless tobacco: Never Used     Comment: Smokes Black and Milds  . Alcohol use Yes     Comment: rare  . Drug use:     Types: Marijuana  . Sexual activity: Not on file   Other Topics Concern  . Not on file   Social History Narrative  . No narrative on file     Review of Systems: General: negative for chills, fever, night  sweats or weight changes.  Cardiovascular: negative for chest pain, dyspnea on exertion, edema, orthopnea, palpitations, paroxysmal nocturnal dyspnea or shortness of breath Dermatological: negative for rash Respiratory: negative for cough or wheezing Urologic: negative for hematuria Abdominal: negative for nausea, vomiting, diarrhea, bright red blood per rectum, melena, or hematemesis Neurologic: negative for visual changes, syncope, or dizziness All other systems reviewed and are otherwise negative except as noted above.    Blood pressure 100/68, pulse (!) 58, height 5\' 11"  (1.803 m), weight 176 lb 6.4 oz (80 kg), SpO2 98 %.  General appearance: alert, cooperative and no distress Neck: no carotid bruit and no JVD Lungs: clear to auscultation bilaterally Heart: regular rate and rhythm, S1, S2 normal, no murmur, click, rub or gallop Extremities: no LEE Pulses: 2+ and symmetric Skin: warm and dry Neurologic: Grossly normal  EKG sinus bradycardia. 58 bpm. No ischemia.  ASSESSMENT AND PLAN:   1. CAD: Status post STEMI secondary to 100% stenosed first marginal lesion, s/p PCI plus drug-eluting stent. Also with 90% stenosed mid RCA also treated with PCI and a drug-eluting stent. He denies any further anginal symptomatology. Continue dual antiplatelet therapy with ASA plus Brilinta, high intensity statin, beta blocker and ARB. We'll refer to phase II cardiac rehabilitation.  2. Systolic left ventricular dysfunction: EF by echo was reduced at 40-45%. He is stable without dyspnea, orthopnea, PND or lower extremity edema. Volume is stable on physical exam. He does not need a diuretic at this point. Continue medical therapy with beta blocker and ARB. Blood pressure and heart rate limits further titration of these medications. Low-sodium diet advise.  Recommend rechecking a 2-D echocardiogram in 3 months.  3. Dyslipidemia: LDL was 81 mg/dL during recent hospitalization. Given known CAD, his target  LDL is less than 70 mg/dL. Continue high intensity statin therapy with Lipitor. Recheck fasting lipid panel plus hepatic function tests 6-8 weeks.  4. Pre-diabetes: HgbA1c was 5.9. Patient encouraged to adjust diet and increase physical activity. This will need to be followed by his PCP.  5. Tobacco use: Patient continues to smoke but reports that he has cut back significantly. He is making strides to discontinue completely.   PLAN  F/u  with Dr. Eldridge DaceVaranasi in 6-8 weeks.  Robbie LisBrittainy Mcgregor Tinnon PA-C 02/08/2016 12:04 PM

## 2016-02-18 ENCOUNTER — Encounter (HOSPITAL_COMMUNITY)
Admission: RE | Admit: 2016-02-18 | Discharge: 2016-02-18 | Disposition: A | Discharge status: Home / Self Care | Payer: BLUE CROSS/BLUE SHIELD | Source: Ambulatory Visit | Attending: Interventional Cardiology | Admitting: Interventional Cardiology

## 2016-02-18 VITALS — BP 120/90 | HR 59 | Ht 69.25 in | Wt 179.2 lb

## 2016-02-18 DIAGNOSIS — Z955 Presence of coronary angioplasty implant and graft: Secondary | ICD-10-CM | POA: Diagnosis present

## 2016-02-18 DIAGNOSIS — I2111 ST elevation (STEMI) myocardial infarction involving right coronary artery: Secondary | ICD-10-CM

## 2016-02-18 DIAGNOSIS — I213 ST elevation (STEMI) myocardial infarction of unspecified site: Secondary | ICD-10-CM | POA: Diagnosis present

## 2016-02-18 NOTE — Progress Notes (Signed)
Cardiac Rehab Medication Review by a Pharmacist  Does the patient  feel that his/her medications are working for him/her?  yes  Has the patient been experiencing any side effects to the medications prescribed?  no  Does the patient measure his/her own blood pressure or blood glucose at home?  no   Does the patient have any problems obtaining medications due to transportation or finances?   no  Understanding of regimen: excellent Understanding of indications: excellent Potential of compliance: excellent  Pharmacist comments: 50 yo male presents ambulating without assistance. He has excellent knowledge of his medication regimen and verbalized importance of compliance with his medications. We spoke briefly about his OTC testosterone supplement which he reports his MD told him to hold for now. I supported this recommendation and instructed him to check with his provider before taking again. Time allowed for questions and discussion. Patient reports no further questions or concerns regarding his medications at this time.   York CeriseKatherine Cook, PharmD Pharmacy Resident  Pager [phone] 02/18/16 8:57 AM

## 2016-02-19 ENCOUNTER — Encounter (HOSPITAL_COMMUNITY): Payer: Self-pay

## 2016-02-19 NOTE — Progress Notes (Signed)
Cardiac Individual Treatment Plan  Patient Details  Name: Hayden Ramirez MRN: [id number] Date of Birth: Mar 15, 1966 Referring Provider:   Flowsheet Row CARDIAC REHAB PHASE II ORIENTATION from 02/18/2016 in [facility] CARDIAC REHAB  Referring Provider  Lance Muss, MD      Initial Encounter Date:  Flowsheet Row CARDIAC REHAB PHASE II ORIENTATION from 02/18/2016 in [facility] CARDIAC REHAB  Date  02/18/16  Referring Provider  Lance Muss, MD      Visit Diagnosis: 01/30/16 ST elevation myocardial infarction involving right coronary artery (HCC)  01/30/16 Status post coronary artery stent placement  Patient's Home Medications on Admission:  Current Outpatient Prescriptions:  .  aspirin 81 MG chewable tablet, Chew 1 tablet (81 mg total) by mouth daily., Disp: , Rfl:  .  atorvastatin (LIPITOR) 80 MG tablet, Take 1 tablet (80 mg total) by mouth daily at 6 PM., Disp: 90 tablet, Rfl: 3 .  losartan (COZAAR) 25 MG tablet, Take 1 tablet (25 mg total) by mouth daily., Disp: 90 tablet, Rfl: 3 .  metoprolol tartrate (LOPRESSOR) 25 MG tablet, Take 0.5 tablets (12.5 mg total) by mouth 2 (two) times daily., Disp: 90 tablet, Rfl: 3 .  ticagrelor (BRILINTA) 90 MG TABS tablet, Take 1 tablet (90 mg total) by mouth 2 (two) times daily., Disp: 180 tablet, Rfl: 3 .  nitroGLYCERIN (NITROSTAT) 0.4 MG SL tablet, Place 1 tablet (0.4 mg total) under the tongue every 5 (five) minutes as needed for chest pain. (Patient not taking: Reported on 02/18/2016), Disp: 25 tablet, Rfl: 3 .  OVER THE COUNTER MEDICATION, Take 2 capsules by mouth daily. Platinum XT - Testosterone Booster, Disp: , Rfl:   Past Medical History: Past Medical History:  Diagnosis Date  . CAD (coronary artery disease)    a. 01/2016: STEMI w/ 100% stenosis 1st Mrg (DES placed) and 90% stenosis mid-RCA (DES placed).   Marland Kitchen HLD (hyperlipidemia)   . Tobacco use     Tobacco Use: History  Smoking Status   . Light Tobacco Smoker  . Packs/day: 1.00  . Types: Cigarettes  Smokeless Tobacco  . Never Used    Comment: Smokes Black and Milds    Labs: Recent Review Flowsheet Data    Labs for ITP Cardiac and Pulmonary Rehab Latest Ref Rng & Units 01/30/2016 01/31/2016 02/02/2016   Cholestrol 0 - 200 mg/dL - - 409   LDLCALC 0 - 99 mg/dL - - 81   HDL >81 mg/dL - - 19(J)   Trlycerides <150 mg/dL - - 97   Hemoglobin Y7W 4.8 - 5.6 % - 5.9(H) -   TCO2 0 - 100 mmol/L 22 - -      Capillary Blood Glucose: No results found for: GLUCAP   Exercise Target Goals: Date: 02/18/16  Exercise Program Goal: Individual exercise prescription set with THRR, safety & activity barriers. Participant demonstrates ability to understand and report RPE using BORG scale, to self-measure pulse accurately, and to acknowledge the importance of the exercise prescription.  Exercise Prescription Goal: Starting with aerobic activity 30 plus minutes a day, 3 days per week for initial exercise prescription. Provide home exercise prescription and guidelines that participant acknowledges understanding prior to discharge.  Activity Barriers & Risk Stratification:     Activity Barriers & Cardiac Risk Stratification - 02/18/16 0938      Activity Barriers & Cardiac Risk Stratification   Activity Barriers Other (comment)   Comments occasional R shoulder pain   Cardiac Risk Stratification Moderate  6 Minute Walk:     6 Minute Walk    Row Name 02/18/16 1515         6 Minute Walk   Phase Initial     Distance 1465 feet     Walk Time 6 minutes     # of Rest Breaks 0     MPH 2.77     METS 4.15     RPE 11     VO2 Peak 14.5     Symptoms No     Resting HR 59 bpm     Resting BP 120/90     Max Ex. HR 70 bpm     Max Ex. BP 120/88     2 Minute Post BP 102/70        Initial Exercise Prescription:     Initial Exercise Prescription - 02/18/16 1500      Date of Initial Exercise RX and Referring Provider   Date  02/18/16   Referring Provider Lance Muss, MD     Treadmill   MPH 2.5   Grade 1   Minutes 10   METs 3.26     Bike   Level 1   Minutes 10   METs 3.36     NuStep   Level 3   Minutes 10   METs 2.2     Prescription Details   Frequency (times per week) 3   Duration Progress to 30 minutes of continuous aerobic without signs/symptoms of physical distress     Intensity   THRR 40-80% of Max Heartrate 69-138   Ratings of Perceived Exertion 11-13   Perceived Dyspnea 0-4     Progression   Progression Continue to progress workloads to maintain intensity without signs/symptoms of physical distress.     Resistance Training   Training Prescription Yes   Weight 3lbs   Reps 10-12      Perform Capillary Blood Glucose checks as needed.  Exercise Prescription Changes:   Exercise Comments:   Discharge Exercise Prescription (Final Exercise Prescription Changes):   Nutrition:  Target Goals: Understanding of nutrition guidelines, daily intake of sodium 1500mg , cholesterol 200mg , calories 30% from fat and 7% or less from saturated fats, daily to have 5 or more servings of fruits and vegetables.  Biometrics:     Pre Biometrics - 02/18/16 1516      Pre Biometrics   Waist Circumference 36.25 inches   Hip Circumference 38.5 inches   Waist to Hip Ratio 0.94 %   Triceps Skinfold 17 mm   % Body Fat 25 %   Grip Strength 44 kg   Flexibility 11 in   Single Leg Stand 30 seconds       Nutrition Therapy Plan and Nutrition Goals:   Nutrition Discharge: Nutrition Scores:   Nutrition Goals Re-Evaluation:   Psychosocial: Target Goals: Acknowledge presence or absence of depression, maximize coping skills, provide positive support system. Participant is able to verbalize types and ability to use techniques and skills needed for reducing stress and depression.  Initial Review & Psychosocial Screening:     Initial Psych Review & Screening - 02/19/16 1052      Family  Dynamics   Good Support System? Yes      Quality of Life Scores:     Quality of Life - 02/18/16 1516      Quality of Life Scores   Health/Function Pre 25.7 %   Socioeconomic Pre 26.71 %   Psych/Spiritual Pre 28.29 %   Family Pre 27.6 %  GLOBAL Pre 26.72 %      PHQ-9: Recent Review Flowsheet Data    [facility]   Decreased Interest 0   Down, Depressed, Hopeless 0   PHQ - 2 Score 0      Psychosocial Evaluation and Intervention:   Psychosocial Re-Evaluation:   Vocational Rehabilitation: Provide vocational rehab assistance to qualifying candidates.   Vocational Rehab Evaluation & Intervention:     Vocational Rehab - 02/19/16 1052      Initial Vocational Rehab Evaluation & Intervention   Assessment shows need for Vocational Rehabilitation No  Pt feels able to return back to work without any difficulty      Education: Education Goals: Education classes will be provided on a weekly basis, covering required topics. Participant will state understanding/return demonstration of topics presented.  Learning Barriers/Preferences:     Learning Barriers/Preferences - 02/18/16 [id number]      Learning Barriers/Preferences   Learning Barriers None   Learning Preferences Pictoral;Skilled Demonstration;Verbal Instruction;Video      Education Topics: Count Your Pulse:  -Group instruction provided by verbal instruction, demonstration, patient participation and written materials to support subject.  Instructors address importance of being able to find your pulse and how to count your pulse when at home without a heart monitor.  Patients get hands on experience counting their pulse with staff help and individually.   Heart Attack, Angina, and Risk Factor Modification:  -Group instruction provided by verbal instruction, video, and written materials to support subject.  Instructors address signs and symptoms of angina and heart attacks.    Also discuss  risk factors for heart disease and how to make changes to improve heart health risk factors.   Functional Fitness:  -Group instruction provided by verbal instruction, demonstration, patient participation, and written materials to support subject.  Instructors address safety measures for doing things around the house.  Discuss how to get up and down off the floor, how to pick things up properly, how to safely get out of a chair without assistance, and balance training.   Meditation and Mindfulness:  -Group instruction provided by verbal instruction, patient participation, and written materials to support subject.  Instructor addresses importance of mindfulness and meditation practice to help reduce stress and improve awareness.  Instructor also leads participants through a meditation exercise.    Stretching for Flexibility and Mobility:  -Group instruction provided by verbal instruction, patient participation, and written materials to support subject.  Instructors lead participants through series of stretches that are designed to increase flexibility thus improving mobility.  These stretches are additional exercise for major muscle groups that are typically performed during regular warm up and cool down.   Hands Only CPR Anytime:  -Group instruction provided by verbal instruction, video, patient participation and written materials to support subject.  Instructors co-teach with AHA video for hands only CPR.  Participants get hands on experience with mannequins.   Nutrition I class: Heart Healthy Eating:  -Group instruction provided by PowerPoint slides, verbal discussion, and written materials to support subject matter. The instructor gives an explanation and review of the Therapeutic Lifestyle Changes diet recommendations, which includes a discussion on lipid goals, dietary fat, sodium, fiber, plant stanol/sterol esters, sugar, and the components of a well-balanced, healthy diet.   Nutrition II  class: Lifestyle Skills:  -Group instruction provided by PowerPoint slides, verbal discussion, and written materials to support subject matter. The instructor gives an explanation and review of label reading, grocery shopping for heart health, heart healthy  recipe modifications, and ways to make healthier choices when eating out.   Diabetes Question & Answer:  -Group instruction provided by PowerPoint slides, verbal discussion, and written materials to support subject matter. The instructor gives an explanation and review of diabetes co-morbidities, pre- and post-prandial blood glucose goals, pre-exercise blood glucose goals, signs, symptoms, and treatment of hypoglycemia and hyperglycemia, and foot care basics.   Diabetes Blitz:  -Group instruction provided by PowerPoint slides, verbal discussion, and written materials to support subject matter. The instructor gives an explanation and review of the physiology behind type 1 and type 2 diabetes, diabetes medications and rational behind using different medications, pre- and post-prandial blood glucose recommendations and Hemoglobin A1c goals, diabetes diet, and exercise including blood glucose guidelines for exercising safely.    Portion Distortion:  -Group instruction provided by PowerPoint slides, verbal discussion, written materials, and food models to support subject matter. The instructor gives an explanation of serving size versus portion size, changes in portions sizes over the last 20 years, and what consists of a serving from each food group.   Stress Management:  -Group instruction provided by verbal instruction, video, and written materials to support subject matter.  Instructors review role of stress in heart disease and how to cope with stress positively.     Exercising on Your Own:  -Group instruction provided by verbal instruction, power point, and written materials to support subject.  Instructors discuss benefits of exercise,  components of exercise, frequency and intensity of exercise, and end points for exercise.  Also discuss use of nitroglycerin and activating EMS.  Review options of places to exercise outside of rehab.  Review guidelines for sex with heart disease.   Cardiac Drugs I:  -Group instruction provided by verbal instruction and written materials to support subject.  Instructor reviews cardiac drug classes: antiplatelets, anticoagulants, beta blockers, and statins.  Instructor discusses reasons, side effects, and lifestyle considerations for each drug class.   Cardiac Drugs II:  -Group instruction provided by verbal instruction and written materials to support subject.  Instructor reviews cardiac drug classes: angiotensin converting enzyme inhibitors (ACE-I), angiotensin II receptor blockers (ARBs), nitrates, and calcium channel blockers.  Instructor discusses reasons, side effects, and lifestyle considerations for each drug class.   Anatomy and Physiology of the Circulatory System:  -Group instruction provided by verbal instruction, video, and written materials to support subject.  Reviews functional anatomy of heart, how it relates to various diagnoses, and what role the heart plays in the overall system.   Knowledge Questionnaire Score:     Knowledge Questionnaire Score - 02/18/16 1509      Knowledge Questionnaire Score   Pre Score 23/28      Core Components/Risk Factors/Patient Goals at Admission:     Personal Goals and Risk Factors at Admission - 02/18/16 1520      Core Components/Risk Factors/Patient Goals on Admission    Weight Management Weight Loss;Yes   Intervention Weight Management: Develop a combined nutrition and exercise program designed to reach desired caloric intake, while maintaining appropriate intake of nutrient and fiber, sodium and fats, and appropriate energy expenditure required for the weight goal.;Weight Management: Provide education and appropriate resources to help  participant work on and attain dietary goals.;Weight Management/Obesity: Establish reasonable short term and long term weight goals.;Obesity: Provide education and appropriate resources to help participant work on and attain dietary goals.   Expected Outcomes Short Term: Continue to assess and modify interventions until short term weight is achieved;Long Term: Adherence to nutrition  and physical activity/exercise program aimed toward attainment of established weight goal;Weight Maintenance: Understanding of the daily nutrition guidelines, which includes 25-35% calories from fat, 7% or less cal from saturated fats, less than 200mg  cholesterol, less than 1.5gm of sodium, & 5 or more servings of fruits and vegetables daily;Weight Loss: Understanding of general recommendations for a balanced deficit meal plan, which promotes 1-2 lb weight loss per week and includes a negative energy balance of [phone] kcal/d;Understanding recommendations for meals to include 15-35% energy as protein, 25-35% energy from fat, 35-60% energy from carbohydrates, less than 200mg  of dietary cholesterol, 20-35 gm of total fiber daily;Understanding of distribution of calorie intake throughout the day with the consumption of 4-5 meals/snacks   Sedentary Yes   Intervention Develop an individualized exercise prescription for aerobic and resistive training based on initial evaluation findings, risk stratification, comorbidities and participant's personal goals.;Provide advice, education, support and counseling about physical activity/exercise needs.   Expected Outcomes Achievement of increased cardiorespiratory fitness and enhanced flexibility, muscular endurance and strength shown through measurements of functional capacity and personal statement of participant.   Increase Strength and Stamina Yes   Intervention Provide advice, education, support and counseling about physical activity/exercise needs.;Develop an individualized exercise  prescription for aerobic and resistive training based on initial evaluation findings, risk stratification, comorbidities and participant's personal goals.   Expected Outcomes Achievement of increased cardiorespiratory fitness and enhanced flexibility, muscular endurance and strength shown through measurements of functional capacity and personal statement of participant.   Tobacco Cessation Yes   Intervention Education officer, environmental, assist with locating and accessing local/national Quit Smoking programs, and support quit date choice.;Assist the participant in steps to quit. Provide individualized education and counseling about committing to Tobacco Cessation, relapse prevention, and pharmacological support that can be provided by physician.   Expected Outcomes Short Term: Will demonstrate readiness to quit, by selecting a quit date.;Short Term: Will quit all tobacco product use, adhering to prevention of relapse plan.;Long Term: Complete abstinence from all tobacco products for at least 12 months from quit date.   Hypertension Yes   Intervention Provide education on lifestyle modifcations including regular physical activity/exercise, weight management, moderate sodium restriction and increased consumption of fresh fruit, vegetables, and low fat dairy, alcohol moderation, and smoking cessation.;Monitor prescription use compliance.   Expected Outcomes Short Term: Continued assessment and intervention until BP is < 140/13mm HG in hypertensive participants. < 130/36mm HG in hypertensive participants with diabetes, heart failure or chronic kidney disease.;Long Term: Maintenance of blood pressure at goal levels.   Lipids Yes   Intervention Provide education and support for participant on nutrition & aerobic/resistive exercise along with prescribed medications to achieve LDL 70mg , HDL >40mg .   Expected Outcomes Short Term: Participant states understanding of desired cholesterol values and is compliant with  medications prescribed. Participant is following exercise prescription and nutrition guidelines.;Long Term: Cholesterol controlled with medications as prescribed, with individualized exercise RX and with personalized nutrition plan. Value goals: LDL < 70mg , HDL > 40 mg.   Personal Goal Other Yes   Personal Goal To increase confidence and learn limitations   Intervention Provide education on cardiac awareness, nutirition and exercise guidelines to improve pt confidence   Expected Outcomes Pt will have increased cardiac awareness and know limitations on what is safe or appropriate for the heart      Core Components/Risk Factors/Patient Goals Review:    Core Components/Risk Factors/Patient Goals at Discharge (Final Review):    ITP Comments:     ITP Comments  Row Name 02/18/16 0936           ITP Comments Dr. Armanda Magic, Medical Director          Comments:  Pt in on 02/18/16 from 0800 - 1030 for cardiac rehab orientation.  As a part of the orientation appt, pt completed 6 minute walk test.  Pt tolerated well with no complaints of cp or sob.  Monitor showed  SR with small QRS and deep T wave inversion.  This was compared to both strips from inpatient and 12 lead EKG.  Showed similarities in nature. Pt is looking forward to participating in cardiac rehab. Alanson Aly, BSN

## 2016-02-22 ENCOUNTER — Encounter (HOSPITAL_COMMUNITY): Payer: Self-pay

## 2016-02-22 ENCOUNTER — Ambulatory Visit (HOSPITAL_COMMUNITY): Payer: BLUE CROSS/BLUE SHIELD

## 2016-02-22 ENCOUNTER — Encounter (HOSPITAL_COMMUNITY): Payer: BLUE CROSS/BLUE SHIELD

## 2016-02-24 ENCOUNTER — Ambulatory Visit (HOSPITAL_COMMUNITY): Payer: BLUE CROSS/BLUE SHIELD

## 2016-02-24 ENCOUNTER — Encounter (HOSPITAL_COMMUNITY)
Admission: RE | Admit: 2016-02-24 | Discharge: 2016-02-24 | Disposition: A | Discharge status: Home / Self Care | Payer: BLUE CROSS/BLUE SHIELD | Source: Ambulatory Visit | Attending: Interventional Cardiology | Admitting: Interventional Cardiology

## 2016-02-24 DIAGNOSIS — I213 ST elevation (STEMI) myocardial infarction of unspecified site: Secondary | ICD-10-CM | POA: Diagnosis not present

## 2016-02-24 DIAGNOSIS — Z955 Presence of coronary angioplasty implant and graft: Secondary | ICD-10-CM

## 2016-02-24 DIAGNOSIS — I2111 ST elevation (STEMI) myocardial infarction involving right coronary artery: Secondary | ICD-10-CM

## 2016-02-24 NOTE — Progress Notes (Signed)
Daily Session Note  Patient Details  Name: Hayden Ramirez MRN: [id number] Date of Birth: [birth year] Referring Provider:   Flowsheet Row CARDIAC REHAB PHASE II ORIENTATION from 02/18/2016 in [place]  Referring Provider  Larae Grooms, MD      Encounter Date: 02/24/2016  Check In:     Session Check In - 02/24/16 1031      Check-In   Location [facility]   Staff Present Maurice Small, RN, BSN;Amber Fair, MS, ACSM RCEP, Exercise Physiologist;Joann Rion, RN, Marga Melnick, RN, BSN   Supervising physician immediately available to respond to emergencies [facility] immediately available   Physician(s) Dr. Marily Memos   Medication changes reported     No   Fall or balance concerns reported    No   Warm-up and Cool-down Performed as group-led instruction   Resistance Training Performed No   VAD Patient? No     Pain Assessment   Currently in Pain? No/denies   Multiple Pain Sites No      Capillary Blood Glucose: No results found for this or any previous visit (from the past 24 hour(s)).   Goals Met:  Exercise tolerated well  Goals Unmet:  Not Applicable  Comments: Pt started cardiac rehab today.  Pt tolerated light exercise without difficulty. VSS, telemetry-Sinus Rhythm with a deep inverted t wave. This has been previously documented, asymptomatic.  Medication list reconciled. Pt denies barriers to medicaiton compliance.  PSYCHOSOCIAL ASSESSMENT:  PHQ-0. Pt exhibits positive coping skills, hopeful outlook with supportive family. No psychosocial needs identified at this time, no psychosocial interventions necessary.    Pt enjoys playing golf.   Pt oriented to exercise equipment and routine.    Understanding verbalized. Barnet Pall, RN,BSN 02/24/2016 1:59 PM   Dr. Fransico Him is Medical Director for Cardiac Rehab at [facility].

## 2016-02-26 ENCOUNTER — Ambulatory Visit (HOSPITAL_COMMUNITY): Payer: BLUE CROSS/BLUE SHIELD

## 2016-02-26 ENCOUNTER — Encounter (HOSPITAL_COMMUNITY)
Admission: RE | Admit: 2016-02-26 | Discharge: 2016-02-26 | Disposition: A | Discharge status: Home / Self Care | Payer: BLUE CROSS/BLUE SHIELD | Source: Ambulatory Visit | Attending: Interventional Cardiology | Admitting: Interventional Cardiology

## 2016-02-26 DIAGNOSIS — Z955 Presence of coronary angioplasty implant and graft: Secondary | ICD-10-CM | POA: Insufficient documentation

## 2016-02-26 DIAGNOSIS — I213 ST elevation (STEMI) myocardial infarction of unspecified site: Secondary | ICD-10-CM | POA: Diagnosis present

## 2016-02-26 DIAGNOSIS — I2111 ST elevation (STEMI) myocardial infarction involving right coronary artery: Secondary | ICD-10-CM

## 2016-02-29 ENCOUNTER — Encounter (HOSPITAL_COMMUNITY): Admission: RE | Admit: 2016-02-29 | Payer: BLUE CROSS/BLUE SHIELD | Source: Ambulatory Visit

## 2016-03-02 ENCOUNTER — Encounter (HOSPITAL_COMMUNITY)
Admission: RE | Admit: 2016-03-02 | Discharge: 2016-03-02 | Disposition: A | Discharge status: Home / Self Care | Payer: BLUE CROSS/BLUE SHIELD | Source: Ambulatory Visit | Attending: Interventional Cardiology | Admitting: Interventional Cardiology

## 2016-03-02 ENCOUNTER — Encounter: Payer: Self-pay | Admitting: Interventional Cardiology

## 2016-03-02 ENCOUNTER — Other Ambulatory Visit: Payer: Self-pay | Admitting: *Deleted

## 2016-03-02 ENCOUNTER — Ambulatory Visit (HOSPITAL_COMMUNITY): Payer: BLUE CROSS/BLUE SHIELD

## 2016-03-02 DIAGNOSIS — I213 ST elevation (STEMI) myocardial infarction of unspecified site: Secondary | ICD-10-CM | POA: Diagnosis not present

## 2016-03-02 DIAGNOSIS — I2111 ST elevation (STEMI) myocardial infarction involving right coronary artery: Secondary | ICD-10-CM

## 2016-03-02 DIAGNOSIS — Z955 Presence of coronary angioplasty implant and graft: Secondary | ICD-10-CM

## 2016-03-02 MED ORDER — TICAGRELOR 90 MG PO TABS: 90.0000 mg | ORAL_TABLET | Freq: Two times a day (BID) | ORAL | 3 refills | Status: DC

## 2016-03-04 ENCOUNTER — Ambulatory Visit (HOSPITAL_COMMUNITY): Payer: BLUE CROSS/BLUE SHIELD

## 2016-03-04 ENCOUNTER — Encounter (HOSPITAL_COMMUNITY)
Admission: RE | Admit: 2016-03-04 | Discharge: 2016-03-04 | Disposition: A | Discharge status: Home / Self Care | Payer: BLUE CROSS/BLUE SHIELD | Source: Ambulatory Visit | Attending: Interventional Cardiology | Admitting: Interventional Cardiology

## 2016-03-04 DIAGNOSIS — I213 ST elevation (STEMI) myocardial infarction of unspecified site: Secondary | ICD-10-CM | POA: Diagnosis not present

## 2016-03-04 DIAGNOSIS — I2111 ST elevation (STEMI) myocardial infarction involving right coronary artery: Secondary | ICD-10-CM

## 2016-03-04 DIAGNOSIS — Z955 Presence of coronary angioplasty implant and graft: Secondary | ICD-10-CM

## 2016-03-07 ENCOUNTER — Ambulatory Visit (HOSPITAL_COMMUNITY): Payer: BLUE CROSS/BLUE SHIELD

## 2016-03-07 ENCOUNTER — Encounter (HOSPITAL_COMMUNITY)
Admission: RE | Admit: 2016-03-07 | Discharge: 2016-03-07 | Disposition: A | Discharge status: Home / Self Care | Payer: BLUE CROSS/BLUE SHIELD | Source: Ambulatory Visit | Attending: Interventional Cardiology | Admitting: Interventional Cardiology

## 2016-03-07 DIAGNOSIS — Z955 Presence of coronary angioplasty implant and graft: Secondary | ICD-10-CM

## 2016-03-07 DIAGNOSIS — I2111 ST elevation (STEMI) myocardial infarction involving right coronary artery: Secondary | ICD-10-CM

## 2016-03-07 DIAGNOSIS — I213 ST elevation (STEMI) myocardial infarction of unspecified site: Secondary | ICD-10-CM | POA: Diagnosis not present

## 2016-03-09 ENCOUNTER — Ambulatory Visit (HOSPITAL_COMMUNITY): Payer: BLUE CROSS/BLUE SHIELD

## 2016-03-09 ENCOUNTER — Encounter (HOSPITAL_COMMUNITY)
Admission: RE | Admit: 2016-03-09 | Discharge: 2016-03-09 | Disposition: A | Discharge status: Home / Self Care | Payer: BLUE CROSS/BLUE SHIELD | Source: Ambulatory Visit | Attending: Interventional Cardiology | Admitting: Interventional Cardiology

## 2016-03-09 DIAGNOSIS — I2111 ST elevation (STEMI) myocardial infarction involving right coronary artery: Secondary | ICD-10-CM

## 2016-03-09 DIAGNOSIS — Z955 Presence of coronary angioplasty implant and graft: Secondary | ICD-10-CM

## 2016-03-09 DIAGNOSIS — I213 ST elevation (STEMI) myocardial infarction of unspecified site: Secondary | ICD-10-CM | POA: Diagnosis not present

## 2016-03-09 NOTE — Progress Notes (Signed)
Reviewed home exercise guidelines with patient including endpoints, temperature precautions, target heart rate and rate of perceived exertion. Pt plans to walk as his mode of home exercise. Pt voices understanding of instructions given. Tyliyah Mcmeekin M Gizel Riedlinger, MS, ACSM CCEP  

## 2016-03-10 NOTE — Progress Notes (Signed)
Cardiac Individual Treatment Plan  Patient Details  Name: Hayden Ramirez MRN: [id number] Date of Birth: June 27, 1966 Referring Provider:   Flowsheet Row CARDIAC REHAB PHASE II ORIENTATION from 02/18/2016 in [facility] CARDIAC REHAB  Referring Provider  Lance Muss, MD      Initial Encounter Date:  Flowsheet Row CARDIAC REHAB PHASE II ORIENTATION from 02/18/2016 in [facility] CARDIAC REHAB  Date  02/18/16  Referring Provider  Lance Muss, MD      Visit Diagnosis: 01/30/16 ST elevation myocardial infarction involving right coronary artery (HCC)  01/30/16 Status post coronary artery stent placement  Patient's Home Medications on Admission:  Current Outpatient Prescriptions:  .  aspirin 81 MG chewable tablet, Chew 1 tablet (81 mg total) by mouth daily., Disp: , Rfl:  .  atorvastatin (LIPITOR) 80 MG tablet, Take 1 tablet (80 mg total) by mouth daily at 6 PM., Disp: 90 tablet, Rfl: 3 .  losartan (COZAAR) 25 MG tablet, Take 1 tablet (25 mg total) by mouth daily., Disp: 90 tablet, Rfl: 3 .  metoprolol tartrate (LOPRESSOR) 25 MG tablet, Take 0.5 tablets (12.5 mg total) by mouth 2 (two) times daily., Disp: 90 tablet, Rfl: 3 .  nitroGLYCERIN (NITROSTAT) 0.4 MG SL tablet, Place 1 tablet (0.4 mg total) under the tongue every 5 (five) minutes as needed for chest pain., Disp: 25 tablet, Rfl: 3 .  OVER THE COUNTER MEDICATION, Take 2 capsules by mouth daily. Platinum XT - Testosterone Booster, Disp: , Rfl:  .  ticagrelor (BRILINTA) 90 MG TABS tablet, Take 1 tablet (90 mg total) by mouth 2 (two) times daily., Disp: 180 tablet, Rfl: 3  Past Medical History: Past Medical History:  Diagnosis Date  . CAD (coronary artery disease)    a. 01/2016: STEMI w/ 100% stenosis 1st Mrg (DES placed) and 90% stenosis mid-RCA (DES placed).   Marland Kitchen HLD (hyperlipidemia)   . Tobacco use     Tobacco Use: History  Smoking Status  . Light Tobacco Smoker  . Packs/day:  1.00  . Types: Cigarettes  Smokeless Tobacco  . Never Used    Comment: Smokes Black and Milds    Labs: Recent Review Flowsheet Data    Labs for ITP Cardiac and Pulmonary Rehab Latest Ref Rng & Units 01/30/2016 01/31/2016 02/02/2016   Cholestrol 0 - 200 mg/dL - - 782   LDLCALC 0 - 99 mg/dL - - 81   HDL >95 mg/dL - - 62(Z)   Trlycerides <150 mg/dL - - 97   Hemoglobin H0Q 4.8 - 5.6 % - 5.9(H) -   TCO2 0 - 100 mmol/L 22 - -      Capillary Blood Glucose: No results found for: GLUCAP   Exercise Target Goals:    Exercise Program Goal: Individual exercise prescription set with THRR, safety & activity barriers. Participant demonstrates ability to understand and report RPE using BORG scale, to self-measure pulse accurately, and to acknowledge the importance of the exercise prescription.  Exercise Prescription Goal: Starting with aerobic activity 30 plus minutes a day, 3 days per week for initial exercise prescription. Provide home exercise prescription and guidelines that participant acknowledges understanding prior to discharge.  Activity Barriers & Risk Stratification:     Activity Barriers & Cardiac Risk Stratification - 02/18/16 0938      Activity Barriers & Cardiac Risk Stratification   Activity Barriers Other (comment)   Comments occasional R shoulder pain   Cardiac Risk Stratification Moderate      6 Minute Walk:  6 Minute Walk    Row Name 02/18/16 1515         6 Minute Walk   Phase Initial     Distance 1465 feet     Walk Time 6 minutes     # of Rest Breaks 0     MPH 2.77     METS 4.15     RPE 11     VO2 Peak 14.5     Symptoms No     Resting HR 59 bpm     Resting BP 120/90     Max Ex. HR 70 bpm     Max Ex. BP 120/88     2 Minute Post BP 102/70        Initial Exercise Prescription:     Initial Exercise Prescription - 02/18/16 1500      Date of Initial Exercise RX and Referring Provider   Date 02/18/16   Referring Provider Lance Muss, MD      Treadmill   MPH 2.5   Grade 1   Minutes 10   METs 3.26     Bike   Level 1   Minutes 10   METs 3.36     NuStep   Level 3   Minutes 10   METs 2.2     Prescription Details   Frequency (times per week) 3   Duration Progress to 30 minutes of continuous aerobic without signs/symptoms of physical distress     Intensity   THRR 40-80% of Max Heartrate 69-138   Ratings of Perceived Exertion 11-13   Perceived Dyspnea 0-4     Progression   Progression Continue to progress workloads to maintain intensity without signs/symptoms of physical distress.     Resistance Training   Training Prescription Yes   Weight 3lbs   Reps 10-12      Perform Capillary Blood Glucose checks as needed.  Exercise Prescription Changes:      Exercise Prescription Changes    Row Name 03/07/16 0700             Exercise Review   Progression Yes         Response to Exercise   Blood Pressure (Admit) 120/80       Blood Pressure (Exercise) 140/90       Blood Pressure (Exit) 120/72       Heart Rate (Admit) 74 bpm       Heart Rate (Exercise) 126 bpm       Heart Rate (Exit) 74 bpm       Rating of Perceived Exertion (Exercise) 12       Symptoms none       Duration Progress to 30 minutes of continuous aerobic without signs/symptoms of physical distress       Intensity THRR unchanged         Progression   Progression Continue to progress workloads to maintain intensity without signs/symptoms of physical distress.       Average METs 4.2         Resistance Training   Training Prescription Yes       Weight 3lbs       Reps 10-12         Interval Training   Interval Training No         Treadmill   MPH 3       Grade 2       Minutes 10       METs 4.12  Bike   Level 1.7       Minutes 10       METs 5.03         NuStep   Level 3       Minutes 10       METs 3.5          Exercise Comments:      Exercise Comments    Row Name 03/09/16 1743           Exercise Comments  Reviewed home exercise guidelines with patient. Doing well with exercise thus far.          Discharge Exercise Prescription (Final Exercise Prescription Changes):     Exercise Prescription Changes - 03/07/16 0700      Exercise Review   Progression Yes     Response to Exercise   Blood Pressure (Admit) 120/80   Blood Pressure (Exercise) 140/90   Blood Pressure (Exit) 120/72   Heart Rate (Admit) 74 bpm   Heart Rate (Exercise) 126 bpm   Heart Rate (Exit) 74 bpm   Rating of Perceived Exertion (Exercise) 12   Symptoms none   Duration Progress to 30 minutes of continuous aerobic without signs/symptoms of physical distress   Intensity THRR unchanged     Progression   Progression Continue to progress workloads to maintain intensity without signs/symptoms of physical distress.   Average METs 4.2     Resistance Training   Training Prescription Yes   Weight 3lbs   Reps 10-12     Interval Training   Interval Training No     Treadmill   MPH 3   Grade 2   Minutes 10   METs 4.12     Bike   Level 1.7   Minutes 10   METs 5.03     NuStep   Level 3   Minutes 10   METs 3.5      Nutrition:  Target Goals: Understanding of nutrition guidelines, daily intake of sodium 1500mg , cholesterol 200mg , calories 30% from fat and 7% or less from saturated fats, daily to have 5 or more servings of fruits and vegetables.  Biometrics:     Pre Biometrics - 02/18/16 1516      Pre Biometrics   Waist Circumference 36.25 inches   Hip Circumference 38.5 inches   Waist to Hip Ratio 0.94 %   Triceps Skinfold 17 mm   % Body Fat 25 %   Grip Strength 44 kg   Flexibility 11 in   Single Leg Stand 30 seconds       Nutrition Therapy Plan and Nutrition Goals:     Nutrition Therapy & Goals - 02/24/16 1158      Nutrition Therapy   Diet Therapeutic Lifestyle Change     Personal Nutrition Goals   Personal Goal #1 1-2 lb/week wt loss to a wt loss goal of 6-24 lb at graduation from Cardiac  Rehab     Intervention Plan   Intervention Prescribe, educate and counsel regarding individualized specific dietary modifications aiming towards targeted core components such as weight, hypertension, lipid management, diabetes, heart failure and other comorbidities.   Expected Outcomes Short Term Goal: Understand basic principles of dietary content, such as calories, fat, sodium, cholesterol and nutrients.;Long Term Goal: Adherence to prescribed nutrition plan.      Nutrition Discharge: Nutrition Scores:     Nutrition Assessments - 02/24/16 1157      MEDFICTS Scores   Pre Score 56  Nutrition Goals Re-Evaluation:   Psychosocial: Target Goals: Acknowledge presence or absence of depression, maximize coping skills, provide positive support system. Participant is able to verbalize types and ability to use techniques and skills needed for reducing stress and depression.  Initial Review & Psychosocial Screening:     Initial Psych Review & Screening - 02/24/16 1417      Barriers   Psychosocial barriers to participate in program There are no identifiable barriers or psychosocial needs.     Screening Interventions   Interventions Encouraged to exercise      Quality of Life Scores:     Quality of Life - 02/18/16 1516      Quality of Life Scores   Health/Function Pre 25.7 %   Socioeconomic Pre 26.71 %   Psych/Spiritual Pre 28.29 %   Family Pre 27.6 %   GLOBAL Pre 26.72 %      PHQ-9: Recent Review Flowsheet Data    Depression screen [facility] 2/9 02/24/2016 07/09/2015   Decreased Interest 0 0   Down, Depressed, Hopeless 0 0   PHQ - 2 Score 0 0      Psychosocial Evaluation and Intervention:   Psychosocial Re-Evaluation:     Psychosocial Re-Evaluation    Row Name 03/10/16 1406             Psychosocial Re-Evaluation   Interventions Encouraged to attend Cardiac Rehabilitation for the exercise       Continued Psychosocial Services Needed No          Vocational  Rehabilitation: Provide vocational rehab assistance to qualifying candidates.   Vocational Rehab Evaluation & Intervention:     Vocational Rehab - 02/19/16 1052      Initial Vocational Rehab Evaluation & Intervention   Assessment shows need for Vocational Rehabilitation No  Pt feels able to return back to work without any difficulty      Education: Education Goals: Education classes will be provided on a weekly basis, covering required topics. Participant will state understanding/return demonstration of topics presented.  Learning Barriers/Preferences:     Learning Barriers/Preferences - 02/18/16 1610      Learning Barriers/Preferences   Learning Barriers None   Learning Preferences Pictoral;Skilled Demonstration;Verbal Instruction;Video      Education Topics: Count Your Pulse:  -Group instruction provided by verbal instruction, demonstration, patient participation and written materials to support subject.  Instructors address importance of being able to find your pulse and how to count your pulse when at home without a heart monitor.  Patients get hands on experience counting their pulse with staff help and individually.   Heart Attack, Angina, and Risk Factor Modification:  -Group instruction provided by verbal instruction, video, and written materials to support subject.  Instructors address signs and symptoms of angina and heart attacks.    Also discuss risk factors for heart disease and how to make changes to improve heart health risk factors.   Functional Fitness:  -Group instruction provided by verbal instruction, demonstration, patient participation, and written materials to support subject.  Instructors address safety measures for doing things around the house.  Discuss how to get up and down off the floor, how to pick things up properly, how to safely get out of a chair without assistance, and balance training.   Meditation and Mindfulness:  -Group instruction  provided by verbal instruction, patient participation, and written materials to support subject.  Instructor addresses importance of mindfulness and meditation practice to help reduce stress and improve awareness.  Instructor also leads participants  through a meditation exercise.  Flowsheet Row CARDIAC REHAB [facility]ASE [facility]  Date  02/24/16  Instruction Review Code  2- meets goals/outcomes      Stretching for Flexibility and Mobility:  -Group instruction provided by verbal instruction, patient participation, and written materials to support subject.  Instructors lead participants through series of stretches that are designed to increase flexibility thus improving mobility.  These stretches are additional exercise for major muscle groups that are typically performed during regular warm up and cool down.   Hands Only CPR Anytime:  -Group instruction provided by verbal instruction, video, patient participation and written materials to support subject.  Instructors co-teach with AHA video for hands only CPR.  Participants get hands on experience with mannequins.   Nutrition I class: Heart Healthy Eating:  -Group instruction provided by PowerPoint slides, verbal discussion, and written materials to support subject matter. The instructor gives an explanation and review of the Therapeutic Lifestyle Changes diet recommendations, which includes a discussion on lipid goals, dietary fat, sodium, fiber, plant stanol/sterol esters, sugar, and the components of a well-balanced, healthy diet.   Nutrition II class: Lifestyle Skills:  -Group instruction provided by PowerPoint slides, verbal discussion, and written materials to support subject matter. The instructor gives an explanation and review of label reading, grocery shopping for heart health, heart healthy recipe modifications, and ways to make healthier choices when eating out.   Diabetes  Question & Answer:  -Group instruction provided by PowerPoint slides, verbal discussion, and written materials to support subject matter. The instructor gives an explanation and review of diabetes co-morbidities, pre- and post-prandial blood glucose goals, pre-exercise blood glucose goals, signs, symptoms, and treatment of hypoglycemia and hyperglycemia, and foot care basics. Flowsheet Row CARDIAC REHAB [facility]ASE [facility]  Date  03/04/16  Educator  RD  Instruction Review Code  2- meets goals/outcomes      Diabetes Blitz:  -Group instruction provided by PowerPoint slides, verbal discussion, and written materials to support subject matter. The instructor gives an explanation and review of the physiology behind type 1 and type 2 diabetes, diabetes medications and rational behind using different medications, pre- and post-prandial blood glucose recommendations and Hemoglobin A1c goals, diabetes diet, and exercise including blood glucose guidelines for exercising safely.    Portion Distortion:  -Group instruction provided by PowerPoint slides, verbal discussion, written materials, and food models to support subject matter. The instructor gives an explanation of serving size versus portion size, changes in portions sizes over the last 20 years, and what consists of a serving from each food group.   Stress Management:  -Group instruction provided by verbal instruction, video, and written materials to support subject matter.  Instructors review role of stress in heart disease and how to cope with stress positively.     Exercising on Your Own:  -Group instruction provided by verbal instruction, power point, and written materials to support subject.  Instructors discuss benefits of exercise, components of exercise, frequency and intensity of exercise, and end points for exercise.  Also discuss use of nitroglycerin and activating EMS.  Review  options of places to exercise outside of rehab.  Review guidelines for sex with heart disease.   Cardiac Drugs I:  -Group instruction provided by verbal instruction and written materials to support subject.  Instructor reviews cardiac drug classes: antiplatelets, anticoagulants, beta blockers, and statins.  Instructor discusses reasons, side effects, and lifestyle  considerations for each drug class.   Cardiac Drugs II:  -Group instruction provided by verbal instruction and written materials to support subject.  Instructor reviews cardiac drug classes: angiotensin converting enzyme inhibitors (ACE-I), angiotensin II receptor blockers (ARBs), nitrates, and calcium channel blockers.  Instructor discusses reasons, side effects, and lifestyle considerations for each drug class. Flowsheet Row CARDIAC REHAB PHASE II EXERCISE from 03/09/2016 in [facility] CARDIAC REHAB  Date  03/09/16  Educator  pharmacy  Instruction Review Code  2- meets goals/outcomes      Anatomy and Physiology of the Circulatory System:  -Group instruction provided by verbal instruction, video, and written materials to support subject.  Reviews functional anatomy of heart, how it relates to various diagnoses, and what role the heart plays in the overall system. Flowsheet Row CARDIAC REHAB PHASE II EXERCISE from 03/09/2016 in [facility] CARDIAC REHAB  Date  03/02/16  Instruction Review Code  2- meets goals/outcomes      Knowledge Questionnaire Score:     Knowledge Questionnaire Score - 02/18/16 1509      Knowledge Questionnaire Score   Pre Score 23/28      Core Components/Risk Factors/Patient Goals at Admission:     Personal Goals and Risk Factors at Admission - 02/18/16 1520      Core Components/Risk Factors/Patient Goals on Admission    Weight Management Weight Loss;Yes   Intervention Weight Management: Develop a combined nutrition and exercise program designed to reach desired  caloric intake, while maintaining appropriate intake of nutrient and fiber, sodium and fats, and appropriate energy expenditure required for the weight goal.;Weight Management: Provide education and appropriate resources to help participant work on and attain dietary goals.;Weight Management/Obesity: Establish reasonable short term and long term weight goals.;Obesity: Provide education and appropriate resources to help participant work on and attain dietary goals.   Expected Outcomes Short Term: Continue to assess and modify interventions until short term weight is achieved;Long Term: Adherence to nutrition and physical activity/exercise program aimed toward attainment of established weight goal;Weight Maintenance: Understanding of the daily nutrition guidelines, which includes 25-35% calories from fat, 7% or less cal from saturated fats, less than 200mg  cholesterol, less than 1.5gm of sodium, & 5 or more servings of fruits and vegetables daily;Weight Loss: Understanding of general recommendations for a balanced deficit meal plan, which promotes 1-2 lb weight loss per week and includes a negative energy balance of [phone] kcal/d;Understanding recommendations for meals to include 15-35% energy as protein, 25-35% energy from fat, 35-60% energy from carbohydrates, less than 200mg  of dietary cholesterol, 20-35 gm of total fiber daily;Understanding of distribution of calorie intake throughout the day with the consumption of 4-5 meals/snacks   Sedentary Yes   Intervention Develop an individualized exercise prescription for aerobic and resistive training based on initial evaluation findings, risk stratification, comorbidities and participant's personal goals.;Provide advice, education, support and counseling about physical activity/exercise needs.   Expected Outcomes Achievement of increased cardiorespiratory fitness and enhanced flexibility, muscular endurance and strength shown through measurements of functional  capacity and personal statement of participant.   Increase Strength and Stamina Yes   Intervention Provide advice, education, support and counseling about physical activity/exercise needs.;Develop an individualized exercise prescription for aerobic and resistive training based on initial evaluation findings, risk stratification, comorbidities and participant's personal goals.   Expected Outcomes Achievement of increased cardiorespiratory fitness and enhanced flexibility, muscular endurance and strength shown through measurements of functional capacity and personal statement of participant.   Tobacco Cessation Yes  Intervention Education officer, environmental, assist with locating and accessing local/national Quit Smoking programs, and support quit date choice.;Assist the participant in steps to quit. Provide individualized education and counseling about committing to Tobacco Cessation, relapse prevention, and pharmacological support that can be provided by physician.   Expected Outcomes Short Term: Will demonstrate readiness to quit, by selecting a quit date.;Short Term: Will quit all tobacco product use, adhering to prevention of relapse plan.;Long Term: Complete abstinence from all tobacco products for at least 12 months from quit date.   Hypertension Yes   Intervention Provide education on lifestyle modifcations including regular physical activity/exercise, weight management, moderate sodium restriction and increased consumption of fresh fruit, vegetables, and low fat dairy, alcohol moderation, and smoking cessation.;Monitor prescription use compliance.   Expected Outcomes Short Term: Continued assessment and intervention until BP is < 140/81mm HG in hypertensive participants. < 130/33mm HG in hypertensive participants with diabetes, heart failure or chronic kidney disease.;Long Term: Maintenance of blood pressure at goal levels.   Lipids Yes   Intervention Provide education and support for participant  on nutrition & aerobic/resistive exercise along with prescribed medications to achieve LDL 70mg , HDL >40mg .   Expected Outcomes Short Term: Participant states understanding of desired cholesterol values and is compliant with medications prescribed. Participant is following exercise prescription and nutrition guidelines.;Long Term: Cholesterol controlled with medications as prescribed, with individualized exercise RX and with personalized nutrition plan. Value goals: LDL < 70mg , HDL > 40 mg.   Personal Goal Other Yes   Personal Goal To increase confidence and learn limitations   Intervention Provide education on cardiac awareness, nutirition and exercise guidelines to improve pt confidence   Expected Outcomes Pt will have increased cardiac awareness and know limitations on what is safe or appropriate for the heart      Core Components/Risk Factors/Patient Goals Review:      Goals and Risk Factor Review    Row Name 03/09/16 1744             Core Components/Risk Factors/Patient Goals Review   Personal Goals Review Other       Review Provided individual home exercise guidelines including frequency, duration and intensity guidelines and target heart rate and rate of perceived exertion parameters.       Expected Outcomes Begin home exercise program in addition to cardiac rehab to achieve fitness and weight loss goals.          Core Components/Risk Factors/Patient Goals at Discharge (Final Review):      Goals and Risk Factor Review - 03/09/16 1744      Core Components/Risk Factors/Patient Goals Review   Personal Goals Review Other   Review Provided individual home exercise guidelines including frequency, duration and intensity guidelines and target heart rate and rate of perceived exertion parameters.   Expected Outcomes Begin home exercise program in addition to cardiac rehab to achieve fitness and weight loss goals.      ITP Comments:     ITP Comments    Row Name 02/18/16 0936            ITP Comments Dr. Armanda Magic, Medical Director          Comments: Ana is making expected progress toward personal goals after completing 10 sessions. Recommend continued exercise and life style modification education including  stress management and relaxation techniques to decrease cardiac risk profile. Gladstone Lighter, RN,BSN 03/11/2016 4:47 PM

## 2016-03-11 ENCOUNTER — Ambulatory Visit (HOSPITAL_COMMUNITY): Payer: BLUE CROSS/BLUE SHIELD

## 2016-03-11 ENCOUNTER — Encounter (HOSPITAL_COMMUNITY)
Admission: RE | Admit: 2016-03-11 | Discharge: 2016-03-11 | Disposition: A | Discharge status: Home / Self Care | Payer: BLUE CROSS/BLUE SHIELD | Source: Ambulatory Visit | Attending: Interventional Cardiology | Admitting: Interventional Cardiology

## 2016-03-11 DIAGNOSIS — I213 ST elevation (STEMI) myocardial infarction of unspecified site: Secondary | ICD-10-CM | POA: Diagnosis not present

## 2016-03-11 DIAGNOSIS — Z955 Presence of coronary angioplasty implant and graft: Secondary | ICD-10-CM

## 2016-03-11 DIAGNOSIS — I2111 ST elevation (STEMI) myocardial infarction involving right coronary artery: Secondary | ICD-10-CM

## 2016-03-14 ENCOUNTER — Ambulatory Visit (HOSPITAL_COMMUNITY): Payer: BLUE CROSS/BLUE SHIELD

## 2016-03-14 ENCOUNTER — Encounter (HOSPITAL_COMMUNITY)
Admission: RE | Admit: 2016-03-14 | Discharge: 2016-03-14 | Disposition: A | Discharge status: Home / Self Care | Payer: BLUE CROSS/BLUE SHIELD | Source: Ambulatory Visit | Attending: Interventional Cardiology | Admitting: Interventional Cardiology

## 2016-03-14 DIAGNOSIS — Z955 Presence of coronary angioplasty implant and graft: Secondary | ICD-10-CM

## 2016-03-14 DIAGNOSIS — I2111 ST elevation (STEMI) myocardial infarction involving right coronary artery: Secondary | ICD-10-CM

## 2016-03-14 DIAGNOSIS — I213 ST elevation (STEMI) myocardial infarction of unspecified site: Secondary | ICD-10-CM | POA: Diagnosis not present

## 2016-03-14 NOTE — Progress Notes (Signed)
Hayden Ramirez 50 y.o. male Nutrition Note Spoke with pt.  Nutrition Survey reviewed with pt. Pt is following Step 1 of the Therapeutic Lifestyle Changes diet. Pt is pre-diabetic. Per discussion, pt was aware of pre-diabetes dx. Pt had some misconceptions re: DM, which were clarified. Pt expressed understanding of the information reviewed. Pt aware of nutrition education classes offered and plans on attending nutrition classes. Lab Results  Component Value Date   HGBA1C 5.9 (H) 01/31/2016   Wt Readings from Last 3 Encounters:  02/18/16 179 lb 3.7 oz (81.3 kg)  02/08/16 176 lb 6.4 oz (80 kg)  01/30/16 182 lb 8.7 oz (82.8 kg)   Nutrition Diagnosis ? Food-and nutrition-related knowledge deficit related to lack of exposure to information as related to diagnosis of: ? CVD ? Pre-DM ? Overweight related to excessive energy intake as evidenced by a BMI of 26.3 Nutrition Intervention ? Benefits of adopting Therapeutic Lifestyle Changes discussed when Medficts reviewed. ? Pt to attend the Portion Distortion class ? Pt to attend the  ? Nutrition I class                      ? Nutrition II class ? Continue client-centered nutrition education by RD, as part of interdisciplinary care.  Goal(s) ? Pt to identify and limit food sources of saturated fat, trans fat, and sodium ? Pt to identify food quantities necessary to achieve weight loss of 6-24 lb (2.7-10.9 kg) at graduation from cardiac rehab.   Monitor and Evaluate progress toward nutrition goal with team.  Hayden PlumbEdna Spenser Ramirez, M.Ed, RD, LDN, CDE 03/14/2016 11:26 AM

## 2016-03-16 ENCOUNTER — Ambulatory Visit (HOSPITAL_COMMUNITY): Payer: BLUE CROSS/BLUE SHIELD

## 2016-03-16 ENCOUNTER — Encounter (HOSPITAL_COMMUNITY): Payer: BLUE CROSS/BLUE SHIELD

## 2016-03-18 ENCOUNTER — Encounter (HOSPITAL_COMMUNITY)
Admission: RE | Admit: 2016-03-18 | Discharge: 2016-03-18 | Disposition: A | Discharge status: Home / Self Care | Payer: BLUE CROSS/BLUE SHIELD | Source: Ambulatory Visit | Attending: Interventional Cardiology | Admitting: Interventional Cardiology

## 2016-03-18 ENCOUNTER — Ambulatory Visit (HOSPITAL_COMMUNITY): Payer: BLUE CROSS/BLUE SHIELD

## 2016-03-18 DIAGNOSIS — I2111 ST elevation (STEMI) myocardial infarction involving right coronary artery: Secondary | ICD-10-CM

## 2016-03-18 DIAGNOSIS — Z955 Presence of coronary angioplasty implant and graft: Secondary | ICD-10-CM

## 2016-03-18 DIAGNOSIS — I213 ST elevation (STEMI) myocardial infarction of unspecified site: Secondary | ICD-10-CM | POA: Diagnosis not present

## 2016-03-21 ENCOUNTER — Ambulatory Visit (HOSPITAL_COMMUNITY): Payer: BLUE CROSS/BLUE SHIELD

## 2016-03-21 ENCOUNTER — Encounter (HOSPITAL_COMMUNITY)
Admission: RE | Admit: 2016-03-21 | Discharge: 2016-03-21 | Disposition: A | Discharge status: Home / Self Care | Payer: BLUE CROSS/BLUE SHIELD | Source: Ambulatory Visit | Attending: Interventional Cardiology | Admitting: Interventional Cardiology

## 2016-03-21 DIAGNOSIS — Z955 Presence of coronary angioplasty implant and graft: Secondary | ICD-10-CM

## 2016-03-21 DIAGNOSIS — I2111 ST elevation (STEMI) myocardial infarction involving right coronary artery: Secondary | ICD-10-CM

## 2016-03-23 ENCOUNTER — Encounter (HOSPITAL_COMMUNITY): Payer: BLUE CROSS/BLUE SHIELD

## 2016-03-23 ENCOUNTER — Ambulatory Visit (HOSPITAL_COMMUNITY): Payer: BLUE CROSS/BLUE SHIELD

## 2016-03-25 ENCOUNTER — Encounter (HOSPITAL_COMMUNITY): Payer: BLUE CROSS/BLUE SHIELD

## 2016-03-25 ENCOUNTER — Ambulatory Visit (HOSPITAL_COMMUNITY): Payer: BLUE CROSS/BLUE SHIELD

## 2016-03-28 ENCOUNTER — Encounter (HOSPITAL_COMMUNITY): Payer: BLUE CROSS/BLUE SHIELD

## 2016-03-28 ENCOUNTER — Encounter: Payer: Self-pay | Admitting: Interventional Cardiology

## 2016-03-28 ENCOUNTER — Ambulatory Visit (INDEPENDENT_AMBULATORY_CARE_PROVIDER_SITE_OTHER): Payer: BLUE CROSS/BLUE SHIELD | Admitting: Interventional Cardiology

## 2016-03-28 ENCOUNTER — Ambulatory Visit (HOSPITAL_COMMUNITY): Payer: BLUE CROSS/BLUE SHIELD

## 2016-03-28 VITALS — BP 100/60 | HR 52 | Ht 71.0 in | Wt 174.6 lb

## 2016-03-28 DIAGNOSIS — E785 Hyperlipidemia, unspecified: Secondary | ICD-10-CM | POA: Diagnosis not present

## 2016-03-28 DIAGNOSIS — I2510 Atherosclerotic heart disease of native coronary artery without angina pectoris: Secondary | ICD-10-CM

## 2016-03-28 DIAGNOSIS — I252 Old myocardial infarction: Secondary | ICD-10-CM

## 2016-03-28 DIAGNOSIS — I25810 Atherosclerosis of coronary artery bypass graft(s) without angina pectoris: Secondary | ICD-10-CM

## 2016-03-28 DIAGNOSIS — Z720 Tobacco use: Secondary | ICD-10-CM

## 2016-03-28 LAB — COMPREHENSIVE METABOLIC PANEL
ALBUMIN: 4.4 g/dL (ref 3.6–5.1)
ALT: 22 U/L (ref 9–46)
AST: 28 U/L (ref 10–40)
Alkaline Phosphatase: 63 U/L (ref 40–115)
BUN: 12 mg/dL (ref 7–25)
CALCIUM: 9.8 mg/dL (ref 8.6–10.3)
CHLORIDE: 105 mmol/L (ref 98–110)
CO2: 23 mmol/L (ref 20–31)
Creat: 1 mg/dL (ref 0.60–1.35)
Glucose, Bld: 79 mg/dL (ref 65–99)
POTASSIUM: 4.7 mmol/L (ref 3.5–5.3)
Sodium: 139 mmol/L (ref 135–146)
TOTAL PROTEIN: 7.5 g/dL (ref 6.1–8.1)
Total Bilirubin: 0.4 mg/dL (ref 0.2–1.2)

## 2016-03-28 LAB — LIPID PANEL
CHOLESTEROL: 83 mg/dL — AB (ref 125–200)
HDL: 29 mg/dL — AB (ref >=40)
LDL CALC: 40 mg/dL (ref <130)
TRIGLYCERIDES: 69 mg/dL (ref <150)
Total CHOL/HDL Ratio: 2.9 Ratio (ref <=5.0)
VLDL: 14 mg/dL (ref <30)

## 2016-03-28 MED ORDER — NICOTINE 21 MG/24HR TD PT24: 21.0000 mg | MEDICATED_PATCH | Freq: Every day | TRANSDERMAL | 3 refills | Status: DC

## 2016-03-28 NOTE — Patient Instructions (Addendum)
Medication Instructions:  Start using Nicotine patches.  Labwork: Today-Lipids and CMEt  Testing/Procedures: Your physician has requested that you have an echocardiogram. Echocardiography is a painless test that uses sound waves to create images of your heart. It provides your doctor with information about the size and shape of your heart and how well your heart's chambers and valves are working. This procedure takes approximately one hour. There are no restrictions for this procedure. To be done in 1 month.  Follow-Up: Your physician wants you to follow-up in: 6 months. You will receive a reminder letter in the mail two months in advance. If you don't receive a letter, please call our office to schedule the follow-up appointment.     If you need a refill on your cardiac medications before your next appointment, please call your pharmacy.

## 2016-03-28 NOTE — Progress Notes (Signed)
Cardiology Office Note   Date:  03/28/2016   ID:  Hayden Ramirez, DOB 03-16-1966, MRN [id number]  PCP:  No PCP Per Patient    No chief complaint on file. CAD/MI   Wt Readings from Last 3 Encounters:  03/28/16 174 lb 9.6 oz (79.2 kg)  02/18/16 179 lb 3.7 oz (81.3 kg)  02/08/16 176 lb 6.4 oz (80 kg)       History of Present Illness: Hayden Ramirez is a 50 y.o. male  CAD. He initially presented to [facility] on 01/30/2016 with chest pain and EKG which showed ST elevations. Code STEMI was activated and he was transferred to [facility] for emergent cardiac cath catheterization.  He was found to have 100% stenosis of 1st Mrg lesion (DES successfully placed with a 0% residual stenosis) and 90% stenosis of the Mid RCA (DES placed as well with 0% residual stenosis). He did develop Torsades while in the cath lab which resolved with DCCV x1. He was started on DAPT with ASA and Brilinta along with high-intensity statin and a BB.  Cyclic troponin values peaked at > 65.00, but were trending down to 48.57 at last check. An echocardiogram was obtained which showed an EF of 40-45% with inferior AK. With his reduced EF, Losartan was added to his medication regimen. A lipid panel was checked which showed an HDL 30 and LDL 81. He was started on high-intensity statin therapy with a goal LDL less than 70. Globin A1c suggested prediabetes with the level of 5.9.  He continues to smoke.  He is weaning himself off.  He is doing well in cardiac rehab.  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         Past Medical History:  Diagnosis Date  . CAD (coronary artery disease)    a. 01/2016: STEMI w/ 100% stenosis 1st Mrg (DES placed) and 90% stenosis mid-RCA (DES placed).   Marland Kitchen HLD (hyperlipidemia)   . Tobacco use     Past Surgical History:  Procedure Laterality Date  . CARDIAC CATHETERIZATION N/A 01/30/2016   Procedure: Left Heart Cath and Coronary  Angiography;  Surgeon: Corky Crafts, MD;  Location[facility] INVASIVE CV LAB;  Service: Cardiovascular;  Laterality: N/A;  . CARDIAC CATHETERIZATION N/A 01/30/2016   Procedure: Coronary Stent Intervention;  Surgeon: Corky Crafts, MD;  Location: [facility] INVASIVE CV LAB;  Service: Cardiovascular;  Laterality: N/A;     Current Outpatient Prescriptions  Medication Sig Dispense Refill  . aspirin 81 MG chewable tablet Chew 1 tablet (81 mg total) by mouth daily.    Marland Kitchen atorvastatin (LIPITOR) 80 MG tablet Take 1 tablet (80 mg total) by mouth daily at 6 PM. 90 tablet 3  . losartan (COZAAR) 25 MG tablet Take 1 tablet (25 mg total) by mouth daily. 90 tablet 3  . metoprolol tartrate (LOPRESSOR) 25 MG tablet Take 0.5 tablets (12.5 mg total) by mouth 2 (two) times daily. 90 tablet 3  . nitroGLYCERIN (NITRODUR - DOSED IN MG/24 HR) 0.4 mg/hr patch Place 0.4 mg onto the skin daily. 3 DOSES MAX    . OVER THE COUNTER MEDICATION Take 2 capsules by mouth daily. Platinum XT - Testosterone Booster    . ticagrelor (BRILINTA) 90 MG TABS tablet Take 1 tablet (90 mg total) by mouth 2 (two) times daily. 180 tablet 3   No current facility-administered medications for this visit.     Allergies:   Review of patient's allergies  indicates no known allergies.    Social History:  The patient  reports that he has been smoking Cigarettes.  He has been smoking about 1.00 pack per day. He has never used smokeless tobacco. He reports that he drinks alcohol. He reports that he uses drugs, including Marijuana.   Family History:  The patient's family history includes Heart attack in his paternal grandfather.    ROS:  Please see the history of present illness.   Otherwise, review of systems are positive for still smoking.   All other systems are reviewed and negative.    PHYSICAL EXAM: VS:  BP 100/60   Pulse (!) 52   Ht 5\' 11"  (1.803 m)   Wt 174 lb 9.6 oz (79.2 kg)   BMI 24.35 kg/m  , BMI Body mass index is 24.35  kg/m. GEN: Well nourished, well developed, in no acute distress  HEENT: normal  Neck: no JVD, carotid bruits, or masses Cardiac: RRR; no murmurs, rubs, or gallops,no edema  Respiratory:  clear to auscultation bilaterally, normal work of breathing GI: soft, nontender, nondistended, + BS MS: no deformity or atrophy  Skin: warm and dry, no rash Neuro:  Strength and sensation are intact Psych: euthymic mood, full affect    Recent Labs: 01/31/2016: ALT 31; Hemoglobin 14.5; Platelets 288 02/01/2016: BUN 7; Creatinine, Ser 1.01; Potassium 3.7; Sodium 134   Lipid Panel    Component Value Date/Time   CHOL 130 02/02/2016 0243   TRIG 97 02/02/2016 0243   HDL 30 (L) 02/02/2016 0243   CHOLHDL 4.3 02/02/2016 0243   VLDL 19 02/02/2016 0243   LDLCALC 81 02/02/2016 0243     Other studies Reviewed: Additional studies/ records that were reviewed today with results demonstrating: cath results.   ASSESSMENT AND PLAN:  1. CAD:  Status post STEMI secondary to 100% stenosed first marginal lesion, s/p PCI plus drug-eluting stent. Also with 90% stenosed mid RCA also treated with PCI and a drug-eluting stent in 8/17. He denies any further anginal symptoms. Continue dual antiplatelet therapy with ASA plus Brilinta, high intensity statin, beta blocker and ARB. Continue phase II cardiac rehabilitation.  No bleeding problems.   2. Hyperlipidemia: Check labs today.  Continue atorvastatin.  3. Tobacco abuse:  Strongly encouraged to stop smoking.  We discussed tools to try to stop.  He has tried the patch in the past.  He is wiling to try the patch.  Will call in the 21mg  patch. 4.  Systolic left ventricular dysfunction: EF by echo was reduced at 40-45% in August 2017. He is stable without dyspnea, orthopnea, PND or lower extremity edema. Volume is stable on physical exam. He does not need a diuretic at this point. Continue medical therapy with beta blocker and ARB. Blood pressure and heart rate limits further  titration of these medications. Low-sodium diet advise.  Recommend rechecking a 2-D echocardiogram in November. 5. He is wondering about supplements that he has at home.  We will check with the PharmD about their safety of use.    Current medicines are reviewed at length with the patient today.  The patient concerns regarding his medicines were addressed.  The following changes have been made:  No change  Labs/ tests ordered today include:  No orders of the defined types were placed in this encounter.   Recommend 150 minutes/week of aerobic exercise Low fat, low carb, high fiber diet recommended  Disposition:   FU in 6 momths   Signed, Lance Muss, MD  03/28/2016  10:47 AM    [facility] [address], [place], [place]  [address] Phone: [phone]; Fax: [phone]

## 2016-03-30 ENCOUNTER — Encounter (HOSPITAL_COMMUNITY): Payer: BLUE CROSS/BLUE SHIELD

## 2016-03-30 ENCOUNTER — Ambulatory Visit (HOSPITAL_COMMUNITY): Payer: BLUE CROSS/BLUE SHIELD

## 2016-04-01 ENCOUNTER — Encounter (HOSPITAL_COMMUNITY): Payer: BLUE CROSS/BLUE SHIELD

## 2016-04-01 ENCOUNTER — Ambulatory Visit (HOSPITAL_COMMUNITY): Payer: BLUE CROSS/BLUE SHIELD

## 2016-04-04 ENCOUNTER — Ambulatory Visit (HOSPITAL_COMMUNITY): Payer: BLUE CROSS/BLUE SHIELD

## 2016-04-04 ENCOUNTER — Encounter (HOSPITAL_COMMUNITY)
Admission: RE | Admit: 2016-04-04 | Discharge: 2016-04-04 | Disposition: A | Discharge status: Home / Self Care | Payer: BLUE CROSS/BLUE SHIELD | Source: Ambulatory Visit | Attending: Interventional Cardiology | Admitting: Interventional Cardiology

## 2016-04-04 DIAGNOSIS — I2111 ST elevation (STEMI) myocardial infarction involving right coronary artery: Secondary | ICD-10-CM

## 2016-04-04 DIAGNOSIS — Z955 Presence of coronary angioplasty implant and graft: Secondary | ICD-10-CM

## 2016-04-04 DIAGNOSIS — I213 ST elevation (STEMI) myocardial infarction of unspecified site: Secondary | ICD-10-CM | POA: Insufficient documentation

## 2016-04-05 ENCOUNTER — Telehealth (HOSPITAL_COMMUNITY): Payer: Self-pay | Admitting: *Deleted

## 2016-04-05 NOTE — Addendum Note (Signed)
Encounter addended by: Artist Paislinty M Skyler Carel on: 04/05/2016  7:25 AM<BR>    Actions taken: Visit Navigator Flowsheet section accepted, Flowsheet data copied forward, Flowsheet accepted

## 2016-04-06 ENCOUNTER — Ambulatory Visit (HOSPITAL_COMMUNITY): Payer: BLUE CROSS/BLUE SHIELD

## 2016-04-06 ENCOUNTER — Encounter (HOSPITAL_COMMUNITY): Payer: BLUE CROSS/BLUE SHIELD

## 2016-04-07 ENCOUNTER — Encounter (HOSPITAL_COMMUNITY): Payer: Self-pay | Admitting: *Deleted

## 2016-04-07 DIAGNOSIS — Z955 Presence of coronary angioplasty implant and graft: Secondary | ICD-10-CM

## 2016-04-07 DIAGNOSIS — I2111 ST elevation (STEMI) myocardial infarction involving right coronary artery: Secondary | ICD-10-CM

## 2016-04-07 NOTE — Progress Notes (Signed)
Cardiac Individual Treatment Plan  Patient Details  Name: Hayden Ramirez MRN: [id number] Date of Birth: 04-May-1966 Referring Provider:   Flowsheet Row CARDIAC REHAB PHASE II ORIENTATION from 02/18/2016 in [facility] CARDIAC REHAB  Referring Provider  Lance Muss, MD      Initial Encounter Date:  Flowsheet Row CARDIAC REHAB PHASE II ORIENTATION from 02/18/2016 in [facility] CARDIAC REHAB  Date  02/18/16  Referring Provider  Lance Muss, MD      Visit Diagnosis: 01/30/16 ST elevation myocardial infarction involving right coronary artery (HCC)  01/30/16 Status post coronary artery stent placement  Patient's Home Medications on Admission:  Current Outpatient Prescriptions:  .  aspirin 81 MG chewable tablet, Chew 1 tablet (81 mg total) by mouth daily., Disp: , Rfl:  .  atorvastatin (LIPITOR) 80 MG tablet, Take 1 tablet (80 mg total) by mouth daily at 6 PM., Disp: 90 tablet, Rfl: 3 .  losartan (COZAAR) 25 MG tablet, Take 1 tablet (25 mg total) by mouth daily., Disp: 90 tablet, Rfl: 3 .  metoprolol tartrate (LOPRESSOR) 25 MG tablet, Take 0.5 tablets (12.5 mg total) by mouth 2 (two) times daily., Disp: 90 tablet, Rfl: 3 .  nicotine (NICODERM CQ - DOSED IN MG/24 HOURS) 21 mg/24hr patch, Place 1 patch (21 mg total) onto the skin daily., Disp: 28 patch, Rfl: 3 .  nitroGLYCERIN (NITRODUR - DOSED IN MG/24 HR) 0.4 mg/hr patch, Place 0.4 mg onto the skin daily. 3 DOSES MAX, Disp: , Rfl:  .  OVER THE COUNTER MEDICATION, Take 2 capsules by mouth daily. Platinum XT - Testosterone Booster, Disp: , Rfl:  .  ticagrelor (BRILINTA) 90 MG TABS tablet, Take 1 tablet (90 mg total) by mouth 2 (two) times daily., Disp: 180 tablet, Rfl: 3  Past Medical History: Past Medical History:  Diagnosis Date  . CAD (coronary artery disease)    a. 01/2016: STEMI w/ 100% stenosis 1st Mrg (DES placed) and 90% stenosis mid-RCA (DES placed).   Marland Kitchen HLD (hyperlipidemia)   .  Tobacco use     Tobacco Use: History  Smoking Status  . Light Tobacco Smoker  . Packs/day: 1.00  . Types: Cigarettes  Smokeless Tobacco  . Never Used    Comment: Smokes Black and Milds    Labs: Recent Review Flowsheet Data    Labs for ITP Cardiac and Pulmonary Rehab Latest Ref Rng & Units 01/30/2016 01/31/2016 02/02/2016 03/28/2016   Cholestrol 125 - 200 mg/dL - - 409 81(X)   LDLCALC <130 mg/dL - - 81 40   HDL >=91 mg/dL - - 47(W) 29(F)   Trlycerides <150 mg/dL - - 97 69   Hemoglobin A1c 4.8 - 5.6 % - 5.9(H) - -   TCO2 0 - 100 mmol/L 22 - - -      Capillary Blood Glucose: No results found for: GLUCAP   Exercise Target Goals:    Exercise Program Goal: Individual exercise prescription set with THRR, safety & activity barriers. Participant demonstrates ability to understand and report RPE using BORG scale, to self-measure pulse accurately, and to acknowledge the importance of the exercise prescription.  Exercise Prescription Goal: Starting with aerobic activity 30 plus minutes a day, 3 days per week for initial exercise prescription. Provide home exercise prescription and guidelines that participant acknowledges understanding prior to discharge.  Activity Barriers & Risk Stratification:     Activity Barriers & Cardiac Risk Stratification - 02/18/16 0938      Activity Barriers & Cardiac Risk  Stratification   Activity Barriers Other (comment)   Comments occasional R shoulder pain   Cardiac Risk Stratification Moderate      6 Minute Walk:     6 Minute Walk    Row Name 02/18/16 1515         6 Minute Walk   Phase Initial     Distance 1465 feet     Walk Time 6 minutes     # of Rest Breaks 0     MPH 2.77     METS 4.15     RPE 11     VO2 Peak 14.5     Symptoms No     Resting HR 59 bpm     Resting BP 120/90     Max Ex. HR 70 bpm     Max Ex. BP 120/88     2 Minute Post BP 102/70        Initial Exercise Prescription:     Initial Exercise Prescription -  02/18/16 1500      Date of Initial Exercise RX and Referring Provider   Date 02/18/16   Referring Provider Lance MussVaranasi, Jayadeep, MD     Treadmill   MPH 2.5   Grade 1   Minutes 10   METs 3.26     Bike   Level 1   Minutes 10   METs 3.36     NuStep   Level 3   Minutes 10   METs 2.2     Prescription Details   Frequency (times per week) 3   Duration Progress to 30 minutes of continuous aerobic without signs/symptoms of physical distress     Intensity   THRR 40-80% of Max Heartrate 69-138   Ratings of Perceived Exertion 11-13   Perceived Dyspnea 0-4     Progression   Progression Continue to progress workloads to maintain intensity without signs/symptoms of physical distress.     Resistance Training   Training Prescription Yes   Weight 3lbs   Reps 10-12      Perform Capillary Blood Glucose checks as needed.  Exercise Prescription Changes:     Exercise Prescription Changes    Row Name 03/07/16 0700 04/05/16 0700           Exercise Review   Progression Yes Yes        Response to Exercise   Blood Pressure (Admit) 120/80 108/60      Blood Pressure (Exercise) 140/90 130/76      Blood Pressure (Exit) 120/72 104/74      Heart Rate (Admit) 74 bpm 82 bpm      Heart Rate (Exercise) 126 bpm 135 bpm      Heart Rate (Exit) 74 bpm 82 bpm      Rating of Perceived Exertion (Exercise) 12 13      Symptoms none none      Comments  - Reviewed home exercise guidelines on 03/09/16.      Duration Progress to 30 minutes of continuous aerobic without signs/symptoms of physical distress Progress to 30 minutes of continuous aerobic without signs/symptoms of physical distress      Intensity THRR unchanged THRR unchanged        Progression   Progression Continue to progress workloads to maintain intensity without signs/symptoms of physical distress. Continue to progress workloads to maintain intensity without signs/symptoms of physical distress.      Average METs 4.2 4.8         Resistance Training   Training Prescription Yes  Yes      Weight 3lbs 4lbs      Reps 10-12 10-12        Interval Training   Interval Training No No        Treadmill   MPH 3 3.2      Grade 2 3      Minutes 10 10      METs 4.12 4.77        Bike   Level 1.7 2      Minutes 10 10      METs 5.03 5.73        NuStep   Level 3 3      Minutes 10 10      METs 3.5 3.9        Home Exercise Plan   Plans to continue exercise at  - Home      Frequency  - Add 3 additional days to program exercise sessions.         Exercise Comments:     Exercise Comments    Row Name 03/09/16 1743 04/05/16 0719         Exercise Comments Reviewed home exercise guidelines with patient. Doing well with exercise thus far. Participant has been out the past 2 1/2 weeks. Had been making good progress with exercise.         Discharge Exercise Prescription (Final Exercise Prescription Changes):     Exercise Prescription Changes - 04/05/16 0700      Exercise Review   Progression Yes     Response to Exercise   Blood Pressure (Admit) 108/60   Blood Pressure (Exercise) 130/76   Blood Pressure (Exit) 104/74   Heart Rate (Admit) 82 bpm   Heart Rate (Exercise) 135 bpm   Heart Rate (Exit) 82 bpm   Rating of Perceived Exertion (Exercise) 13   Symptoms none   Comments Reviewed home exercise guidelines on 03/09/16.   Duration Progress to 30 minutes of continuous aerobic without signs/symptoms of physical distress   Intensity THRR unchanged     Progression   Progression Continue to progress workloads to maintain intensity without signs/symptoms of physical distress.   Average METs 4.8     Resistance Training   Training Prescription Yes   Weight 4lbs   Reps 10-12     Interval Training   Interval Training No     Treadmill   MPH 3.2   Grade 3   Minutes 10   METs 4.77     Bike   Level 2   Minutes 10   METs 5.73     NuStep   Level 3   Minutes 10   METs 3.9     Home Exercise Plan   Plans  to continue exercise at Home   Frequency Add 3 additional days to program exercise sessions.      Nutrition:  Target Goals: Understanding of nutrition guidelines, daily intake of sodium 1500mg , cholesterol 200mg , calories 30% from fat and 7% or less from saturated fats, daily to have 5 or more servings of fruits and vegetables.  Biometrics:     Pre Biometrics - 02/18/16 1516      Pre Biometrics   Waist Circumference 36.25 inches   Hip Circumference 38.5 inches   Waist to Hip Ratio 0.94 %   Triceps Skinfold 17 mm   % Body Fat 25 %   Grip Strength 44 kg   Flexibility 11 in   Single Leg Stand 30 seconds  Nutrition Therapy Plan and Nutrition Goals:     Nutrition Therapy & Goals - 02/24/16 1158      Nutrition Therapy   Diet Therapeutic Lifestyle Change     Personal Nutrition Goals   Personal Goal #1 1-2 lb/week wt loss to a wt loss goal of 6-24 lb at graduation from Cardiac Rehab     Intervention Plan   Intervention Prescribe, educate and counsel regarding individualized specific dietary modifications aiming towards targeted core components such as weight, hypertension, lipid management, diabetes, heart failure and other comorbidities.   Expected Outcomes Short Term Goal: Understand basic principles of dietary content, such as calories, fat, sodium, cholesterol and nutrients.;Long Term Goal: Adherence to prescribed nutrition plan.      Nutrition Discharge: Nutrition Scores:     Nutrition Assessments - 02/24/16 1157      MEDFICTS Scores   Pre Score 56      Nutrition Goals Re-Evaluation:   Psychosocial: Target Goals: Acknowledge presence or absence of depression, maximize coping skills, provide positive support system. Participant is able to verbalize types and ability to use techniques and skills needed for reducing stress and depression.  Initial Review & Psychosocial Screening:     Initial Psych Review & Screening - 02/24/16 1417      Barriers    Psychosocial barriers to participate in program There are no identifiable barriers or psychosocial needs.     Screening Interventions   Interventions Encouraged to exercise      Quality of Life Scores:     Quality of Life - 02/18/16 1516      Quality of Life Scores   Health/Function Pre 25.7 %   Socioeconomic Pre 26.71 %   Psych/Spiritual Pre 28.29 %   Family Pre 27.6 %   GLOBAL Pre 26.72 %      PHQ-9: Recent Review Flowsheet Data    Depression screen [facility] 2/9 02/24/2016 07/09/2015   Decreased Interest 0 0   Down, Depressed, Hopeless 0 0   PHQ - 2 Score 0 0      Psychosocial Evaluation and Intervention:   Psychosocial Re-Evaluation:     Psychosocial Re-Evaluation    Row Name 03/10/16 1406 04/07/16 1637           Psychosocial Re-Evaluation   Interventions Encouraged to attend Cardiac Rehabilitation for the exercise Encouraged to attend Cardiac Rehabilitation for the exercise      Continued Psychosocial Services Needed No No         Vocational Rehabilitation: Provide vocational rehab assistance to qualifying candidates.   Vocational Rehab Evaluation & Intervention:     Vocational Rehab - 02/19/16 1052      Initial Vocational Rehab Evaluation & Intervention   Assessment shows need for Vocational Rehabilitation No  Pt feels able to return back to work without any difficulty      Education: Education Goals: Education classes will be provided on a weekly basis, covering required topics. Participant will state understanding/return demonstration of topics presented.  Learning Barriers/Preferences:     Learning Barriers/Preferences - 02/18/16 1610      Learning Barriers/Preferences   Learning Barriers None   Learning Preferences Pictoral;Skilled Demonstration;Verbal Instruction;Video      Education Topics: Count Your Pulse:  -Group instruction provided by verbal instruction, demonstration, patient participation and written materials to support  subject.  Instructors address importance of being able to find your pulse and how to count your pulse when at home without a heart monitor.  Patients get hands on experience counting  their pulse with staff help and individually.   Heart Attack, Angina, and Risk Factor Modification:  -Group instruction provided by verbal instruction, video, and written materials to support subject.  Instructors address signs and symptoms of angina and heart attacks.    Also discuss risk factors for heart disease and how to make changes to improve heart health risk factors.   Functional Fitness:  -Group instruction provided by verbal instruction, demonstration, patient participation, and written materials to support subject.  Instructors address safety measures for doing things around the house.  Discuss how to get up and down off the floor, how to pick things up properly, how to safely get out of a chair without assistance, and balance training. Flowsheet Row CARDIAC REHAB PHASE II EXERCISE from 03/11/2016 in [facility] CARDIAC REHAB  Date  03/11/16  Instruction Review Code  2- meets goals/outcomes      Meditation and Mindfulness:  -Group instruction provided by verbal instruction, patient participation, and written materials to support subject.  Instructor addresses importance of mindfulness and meditation practice to help reduce stress and improve awareness.  Instructor also leads participants through a meditation exercise.  Flowsheet Row CARDIAC REHAB PHASE II EXERCISE from 03/11/2016 in [facility] CARDIAC REHAB  Date  02/24/16  Instruction Review Code  2- meets goals/outcomes      Stretching for Flexibility and Mobility:  -Group instruction provided by verbal instruction, patient participation, and written materials to support subject.  Instructors lead participants through series of stretches that are designed to increase flexibility thus improving mobility.  These  stretches are additional exercise for major muscle groups that are typically performed during regular warm up and cool down.   Hands Only CPR Anytime:  -Group instruction provided by verbal instruction, video, patient participation and written materials to support subject.  Instructors co-teach with AHA video for hands only CPR.  Participants get hands on experience with mannequins.   Nutrition I class: Heart Healthy Eating:  -Group instruction provided by PowerPoint slides, verbal discussion, and written materials to support subject matter. The instructor gives an explanation and review of the Therapeutic Lifestyle Changes diet recommendations, which includes a discussion on lipid goals, dietary fat, sodium, fiber, plant stanol/sterol esters, sugar, and the components of a well-balanced, healthy diet.   Nutrition II class: Lifestyle Skills:  -Group instruction provided by PowerPoint slides, verbal discussion, and written materials to support subject matter. The instructor gives an explanation and review of label reading, grocery shopping for heart health, heart healthy recipe modifications, and ways to make healthier choices when eating out.   Diabetes Question & Answer:  -Group instruction provided by PowerPoint slides, verbal discussion, and written materials to support subject matter. The instructor gives an explanation and review of diabetes co-morbidities, pre- and post-prandial blood glucose goals, pre-exercise blood glucose goals, signs, symptoms, and treatment of hypoglycemia and hyperglycemia, and foot care basics. Flowsheet Row CARDIAC REHAB PHASE II EXERCISE from 03/11/2016 in [facility] CARDIAC REHAB  Date  03/04/16  Educator  RD  Instruction Review Code  2- meets goals/outcomes      Diabetes Blitz:  -Group instruction provided by PowerPoint slides, verbal discussion, and written materials to support subject matter. The instructor gives an explanation and review  of the physiology behind type 1 and type 2 diabetes, diabetes medications and rational behind using different medications, pre- and post-prandial blood glucose recommendations and Hemoglobin A1c goals, diabetes diet, and exercise including blood glucose guidelines for exercising safely.  Portion Distortion:  -Group instruction provided by PowerPoint slides, verbal discussion, written materials, and food models to support subject matter. The instructor gives an explanation of serving size versus portion size, changes in portions sizes over the last 20 years, and what consists of a serving from each food group.   Stress Management:  -Group instruction provided by verbal instruction, video, and written materials to support subject matter.  Instructors review role of stress in heart disease and how to cope with stress positively.     Exercising on Your Own:  -Group instruction provided by verbal instruction, power point, and written materials to support subject.  Instructors discuss benefits of exercise, components of exercise, frequency and intensity of exercise, and end points for exercise.  Also discuss use of nitroglycerin and activating EMS.  Review options of places to exercise outside of rehab.  Review guidelines for sex with heart disease.   Cardiac Drugs I:  -Group instruction provided by verbal instruction and written materials to support subject.  Instructor reviews cardiac drug classes: antiplatelets, anticoagulants, beta blockers, and statins.  Instructor discusses reasons, side effects, and lifestyle considerations for each drug class.   Cardiac Drugs II:  -Group instruction provided by verbal instruction and written materials to support subject.  Instructor reviews cardiac drug classes: angiotensin converting enzyme inhibitors (ACE-I), angiotensin II receptor blockers (ARBs), nitrates, and calcium channel blockers.  Instructor discusses reasons, side effects, and lifestyle  considerations for each drug class. Flowsheet Row CARDIAC REHAB PHASE II EXERCISE from 03/11/2016 in [facility] CARDIAC REHAB  Date  03/09/16  Educator  pharmacy  Instruction Review Code  2- meets goals/outcomes      Anatomy and Physiology of the Circulatory System:  -Group instruction provided by verbal instruction, video, and written materials to support subject.  Reviews functional anatomy of heart, how it relates to various diagnoses, and what role the heart plays in the overall system. Flowsheet Row CARDIAC REHAB PHASE II EXERCISE from 03/11/2016 in [facility] CARDIAC REHAB  Date  03/02/16  Instruction Review Code  2- meets goals/outcomes      Knowledge Questionnaire Score:     Knowledge Questionnaire Score - 02/18/16 1509      Knowledge Questionnaire Score   Pre Score 23/28      Core Components/Risk Factors/Patient Goals at Admission:     Personal Goals and Risk Factors at Admission - 02/18/16 1520      Core Components/Risk Factors/Patient Goals on Admission    Weight Management Weight Loss;Yes   Intervention Weight Management: Develop a combined nutrition and exercise program designed to reach desired caloric intake, while maintaining appropriate intake of nutrient and fiber, sodium and fats, and appropriate energy expenditure required for the weight goal.;Weight Management: Provide education and appropriate resources to help participant work on and attain dietary goals.;Weight Management/Obesity: Establish reasonable short term and long term weight goals.;Obesity: Provide education and appropriate resources to help participant work on and attain dietary goals.   Expected Outcomes Short Term: Continue to assess and modify interventions until short term weight is achieved;Long Term: Adherence to nutrition and physical activity/exercise program aimed toward attainment of established weight goal;Weight Maintenance: Understanding of the daily  nutrition guidelines, which includes 25-35% calories from fat, 7% or less cal from saturated fats, less than 200mg  cholesterol, less than 1.5gm of sodium, & 5 or more servings of fruits and vegetables daily;Weight Loss: Understanding of general recommendations for a balanced deficit meal plan, which promotes 1-2 lb weight loss per  week and includes a negative energy balance of [phone] kcal/d;Understanding recommendations for meals to include 15-35% energy as protein, 25-35% energy from fat, 35-60% energy from carbohydrates, less than 200mg  of dietary cholesterol, 20-35 gm of total fiber daily;Understanding of distribution of calorie intake throughout the day with the consumption of 4-5 meals/snacks   Sedentary Yes   Intervention Develop an individualized exercise prescription for aerobic and resistive training based on initial evaluation findings, risk stratification, comorbidities and participant's personal goals.;Provide advice, education, support and counseling about physical activity/exercise needs.   Expected Outcomes Achievement of increased cardiorespiratory fitness and enhanced flexibility, muscular endurance and strength shown through measurements of functional capacity and personal statement of participant.   Increase Strength and Stamina Yes   Intervention Provide advice, education, support and counseling about physical activity/exercise needs.;Develop an individualized exercise prescription for aerobic and resistive training based on initial evaluation findings, risk stratification, comorbidities and participant's personal goals.   Expected Outcomes Achievement of increased cardiorespiratory fitness and enhanced flexibility, muscular endurance and strength shown through measurements of functional capacity and personal statement of participant.   Tobacco Cessation Yes   Intervention Education officer, environmental, assist with locating and accessing local/national Quit Smoking programs, and support  quit date choice.;Assist the participant in steps to quit. Provide individualized education and counseling about committing to Tobacco Cessation, relapse prevention, and pharmacological support that can be provided by physician.   Expected Outcomes Short Term: Will demonstrate readiness to quit, by selecting a quit date.;Short Term: Will quit all tobacco product use, adhering to prevention of relapse plan.;Long Term: Complete abstinence from all tobacco products for at least 12 months from quit date.   Hypertension Yes   Intervention Provide education on lifestyle modifcations including regular physical activity/exercise, weight management, moderate sodium restriction and increased consumption of fresh fruit, vegetables, and low fat dairy, alcohol moderation, and smoking cessation.;Monitor prescription use compliance.   Expected Outcomes Short Term: Continued assessment and intervention until BP is < 140/31mm HG in hypertensive participants. < 130/68mm HG in hypertensive participants with diabetes, heart failure or chronic kidney disease.;Long Term: Maintenance of blood pressure at goal levels.   Lipids Yes   Intervention Provide education and support for participant on nutrition & aerobic/resistive exercise along with prescribed medications to achieve LDL 70mg , HDL >40mg .   Expected Outcomes Short Term: Participant states understanding of desired cholesterol values and is compliant with medications prescribed. Participant is following exercise prescription and nutrition guidelines.;Long Term: Cholesterol controlled with medications as prescribed, with individualized exercise RX and with personalized nutrition plan. Value goals: LDL < 70mg , HDL > 40 mg.   Personal Goal Other Yes   Personal Goal To increase confidence and learn limitations   Intervention Provide education on cardiac awareness, nutirition and exercise guidelines to improve pt confidence   Expected Outcomes Pt will have increased cardiac  awareness and know limitations on what is safe or appropriate for the heart      Core Components/Risk Factors/Patient Goals Review:      Goals and Risk Factor Review    Row Name 03/09/16 1744 04/05/16 0721           Core Components/Risk Factors/Patient Goals Review   Personal Goals Review Other Weight Management/Obesity;Sedentary;Other      Review Provided individual home exercise guidelines including frequency, duration and intensity guidelines and target heart rate and rate of perceived exertion parameters. Patient has lost 3.5 lbs thus far. Home exercise guidelines reviewed with patient including parameters and endpoints for exercise.  Expected Outcomes Begin home exercise program in addition to cardiac rehab to achieve fitness and weight loss goals. Maintain a regular aerobic and resistance training program to help achieve health and fitness goals.         Core Components/Risk Factors/Patient Goals at Discharge (Final Review):      Goals and Risk Factor Review - 04/05/16 0721      Core Components/Risk Factors/Patient Goals Review   Personal Goals Review Weight Management/Obesity;Sedentary;Other   Review Patient has lost 3.5 lbs thus far. Home exercise guidelines reviewed with patient including parameters and endpoints for exercise.   Expected Outcomes Maintain a regular aerobic and resistance training program to help achieve health and fitness goals.      ITP Comments:     ITP Comments    Row Name 02/18/16 0936           ITP Comments Dr. Armanda Magic, Medical Director          Comments: Donavon's last day of exercise was on 03/18/2016. I have left a message for Lea to contact cardiac rehab.Gladstone Lighter, RN,BSN 04/07/2016 4:40 PM

## 2016-04-08 ENCOUNTER — Encounter (HOSPITAL_COMMUNITY): Payer: BLUE CROSS/BLUE SHIELD

## 2016-04-08 ENCOUNTER — Ambulatory Visit (HOSPITAL_COMMUNITY): Payer: BLUE CROSS/BLUE SHIELD

## 2016-04-11 ENCOUNTER — Encounter (HOSPITAL_COMMUNITY): Payer: BLUE CROSS/BLUE SHIELD

## 2016-04-11 ENCOUNTER — Ambulatory Visit (HOSPITAL_COMMUNITY): Payer: BLUE CROSS/BLUE SHIELD

## 2016-04-13 ENCOUNTER — Encounter (HOSPITAL_COMMUNITY): Payer: BLUE CROSS/BLUE SHIELD

## 2016-04-13 ENCOUNTER — Ambulatory Visit (HOSPITAL_COMMUNITY): Payer: BLUE CROSS/BLUE SHIELD

## 2016-04-14 ENCOUNTER — Telehealth (HOSPITAL_COMMUNITY): Payer: Self-pay | Admitting: *Deleted

## 2016-04-14 ENCOUNTER — Encounter (HOSPITAL_COMMUNITY): Payer: Self-pay | Admitting: *Deleted

## 2016-04-14 DIAGNOSIS — Z955 Presence of coronary angioplasty implant and graft: Secondary | ICD-10-CM

## 2016-04-14 DIAGNOSIS — I2111 ST elevation (STEMI) myocardial infarction involving right coronary artery: Secondary | ICD-10-CM

## 2016-04-14 NOTE — Progress Notes (Signed)
Discharge Summary  Patient Details  Name: Hayden Ramirez MRN: [id number] Date of Birth: 10/20/1965 Referring Provider:   Flowsheet Row CARDIAC REHAB PHASE II ORIENTATION [facility]  Referring Provider  Lance MussVaranasi, Jayadeep, MD       Number of Visits: 10  Reason for Discharge:  Early Exit:  non attendance  Smoking History:  History  Smoking Status  . Light Tobacco Smoker  . Packs/day: 1.00  . Types: Cigarettes  Smokeless Tobacco  . Never Used    Comment: Smokes Black and Milds    Diagnosis:  01/30/16 ST elevation myocardial infarction involving right coronary artery (HCC)  01/30/16 Status post coronary artery stent placement  ADL UCSD:   Initial Exercise Prescription:     Initial Exercise Prescription - 02/18/16 1500      Date of Initial Exercise RX and Referring Provider   Date 02/18/16   Referring Provider Lance MussVaranasi, Jayadeep, MD     Treadmill   MPH 2.5   Grade 1   Minutes 10   METs 3.26     Bike   Level 1   Minutes 10   METs 3.36     NuStep   Level 3   Minutes 10   METs 2.2     Prescription Details   Frequency (times per week) 3   Duration Progress to 30 minutes of continuous aerobic without signs/symptoms of physical distress     Intensity   THRR 40-80% of Max Heartrate 69-138   Ratings of Perceived Exertion 11-13   Perceived Dyspnea 0-4     Progression   Progression Continue to progress workloads to maintain intensity without signs/symptoms of physical distress.     Resistance Training   Training Prescription Yes   Weight 3lbs   Reps 10-12      Discharge Exercise Prescription (Final Exercise Prescription Changes):     Exercise Prescription Changes - 04/05/16 0700      Exercise Review   Progression Yes     Response to Exercise   Blood Pressure (Admit) 108/60   Blood Pressure (Exercise) 130/76   Blood Pressure (Exit) 104/74   Heart Rate (Admit) 82 bpm   Heart Rate (Exercise)  135 bpm   Heart Rate (Exit) 82 bpm   Rating of Perceived Exertion (Exercise) 13   Symptoms none   Comments Reviewed home exercise guidelines on 03/09/16.   Duration Progress to 30 minutes of continuous aerobic without signs/symptoms of physical distress   Intensity THRR unchanged     Progression   Progression Continue to progress workloads to maintain intensity without signs/symptoms of physical distress.   Average METs 4.8     Resistance Training   Training Prescription Yes   Weight 4lbs   Reps 10-12     Interval Training   Interval Training No     Treadmill   MPH 3.2   Grade 3   Minutes 10   METs 4.77     Bike   Level 2   Minutes 10   METs 5.73     NuStep   Level 3   Minutes 10   METs 3.9     Home Exercise Plan   Plans to continue exercise at Home   Frequency Add 3 additional days to program exercise sessions.      Functional Capacity:     6 Minute Walk    Row Name 02/18/16 1515         6 Minute Walk  Phase Initial     Distance 1465 feet     Walk Time 6 minutes     # of Rest Breaks 0     MPH 2.77     METS 4.15     RPE 11     VO2 Peak 14.5     Symptoms No     Resting HR 59 bpm     Resting BP 120/90     Max Ex. HR 70 bpm     Max Ex. BP 120/88     2 Minute Post BP 102/70        Psychological, QOL, Others - Outcomes: PHQ 2/9: Depression screen [facility] 2/9 02/24/2016 07/09/2015  Decreased Interest 0 0  Down, Depressed, Hopeless 0 0  PHQ - 2 Score 0 0    Quality of Life:     Quality of Life - 02/18/16 1516      Quality of Life Scores   Health/Function Pre 25.7 %   Socioeconomic Pre 26.71 %   Psych/Spiritual Pre 28.29 %   Family Pre 27.6 %   GLOBAL Pre 26.72 %      Personal Goals: Goals established at orientation with interventions provided to work toward goal.     Personal Goals and Risk Factors at Admission - 02/18/16 1520      Core Components/Risk Factors/Patient Goals on Admission    Weight Management Weight Loss;Yes    Intervention Weight Management: Develop a combined nutrition and exercise program designed to reach desired caloric intake, while maintaining appropriate intake of nutrient and fiber, sodium and fats, and appropriate energy expenditure required for the weight goal.;Weight Management: Provide education and appropriate resources to help participant work on and attain dietary goals.;Weight Management/Obesity: Establish reasonable short term and long term weight goals.;Obesity: Provide education and appropriate resources to help participant work on and attain dietary goals.   Expected Outcomes Short Term: Continue to assess and modify interventions until short term weight is achieved;Long Term: Adherence to nutrition and physical activity/exercise program aimed toward attainment of established weight goal;Weight Maintenance: Understanding of the daily nutrition guidelines, which includes 25-35% calories from fat, 7% or less cal from saturated fats, less than 200mg  cholesterol, less than 1.5gm of sodium, & 5 or more servings of fruits and vegetables daily;Weight Loss: Understanding of general recommendations for a balanced deficit meal plan, which promotes 1-2 lb weight loss per week and includes a negative energy balance of [phone] kcal/d;Understanding recommendations for meals to include 15-35% energy as protein, 25-35% energy from fat, 35-60% energy from carbohydrates, less than 200mg  of dietary cholesterol, 20-35 gm of total fiber daily;Understanding of distribution of calorie intake throughout the day with the consumption of 4-5 meals/snacks   Sedentary Yes   Intervention Develop an individualized exercise prescription for aerobic and resistive training based on initial evaluation findings, risk stratification, comorbidities and participant's personal goals.;Provide advice, education, support and counseling about physical activity/exercise needs.   Expected Outcomes Achievement of increased cardiorespiratory  fitness and enhanced flexibility, muscular endurance and strength shown through measurements of functional capacity and personal statement of participant.   Increase Strength and Stamina Yes   Intervention Provide advice, education, support and counseling about physical activity/exercise needs.;Develop an individualized exercise prescription for aerobic and resistive training based on initial evaluation findings, risk stratification, comorbidities and participant's personal goals.   Expected Outcomes Achievement of increased cardiorespiratory fitness and enhanced flexibility, muscular endurance and strength shown through measurements of functional capacity and personal statement of participant.   Tobacco Cessation  Yes   Intervention Education officer, environmental, assist with locating and accessing local/national Quit Smoking programs, and support quit date choice.;Assist the participant in steps to quit. Provide individualized education and counseling about committing to Tobacco Cessation, relapse prevention, and pharmacological support that can be provided by physician.   Expected Outcomes Short Term: Will demonstrate readiness to quit, by selecting a quit date.;Short Term: Will quit all tobacco product use, adhering to prevention of relapse plan.;Long Term: Complete abstinence from all tobacco products for at least 12 months from quit date.   Hypertension Yes   Intervention Provide education on lifestyle modifcations including regular physical activity/exercise, weight management, moderate sodium restriction and increased consumption of fresh fruit, vegetables, and low fat dairy, alcohol moderation, and smoking cessation.;Monitor prescription use compliance.   Expected Outcomes Short Term: Continued assessment and intervention until BP is < 140/27mm HG in hypertensive participants. < 130/9mm HG in hypertensive participants with diabetes, heart failure or chronic kidney disease.;Long Term: Maintenance of  blood pressure at goal levels.   Lipids Yes   Intervention Provide education and support for participant on nutrition & aerobic/resistive exercise along with prescribed medications to achieve LDL 70mg , HDL >40mg .   Expected Outcomes Short Term: Participant states understanding of desired cholesterol values and is compliant with medications prescribed. Participant is following exercise prescription and nutrition guidelines.;Long Term: Cholesterol controlled with medications as prescribed, with individualized exercise RX and with personalized nutrition plan. Value goals: LDL < 70mg , HDL > 40 mg.   Personal Goal Other Yes   Personal Goal To increase confidence and learn limitations   Intervention Provide education on cardiac awareness, nutirition and exercise guidelines to improve pt confidence   Expected Outcomes Pt will have increased cardiac awareness and know limitations on what is safe or appropriate for the heart       Personal Goals Discharge:     Goals and Risk Factor Review    Row Name 03/09/16 1744 04/05/16 1610           Core Components/Risk Factors/Patient Goals Review   Personal Goals Review Other Weight Management/Obesity;Sedentary;Other      Review Provided individual home exercise guidelines including frequency, duration and intensity guidelines and target heart rate and rate of perceived exertion parameters. Patient has lost 3.5 lbs thus far. Home exercise guidelines reviewed with patient including parameters and endpoints for exercise.      Expected Outcomes Begin home exercise program in addition to cardiac rehab to achieve fitness and weight loss goals. Maintain a regular aerobic and resistance training program to help achieve health and fitness goals.         Nutrition & Weight - Outcomes:     Pre Biometrics - 02/18/16 1516      Pre Biometrics   Waist Circumference 36.25 inches   Hip Circumference 38.5 inches   Waist to Hip Ratio 0.94 %   Triceps Skinfold 17 mm    % Body Fat 25 %   Grip Strength 44 kg   Flexibility 11 in   Single Leg Stand 30 seconds       Nutrition:     Nutrition Therapy & Goals - 02/24/16 1158      Nutrition Therapy   Diet Therapeutic Lifestyle Change     Personal Nutrition Goals   Personal Goal #1 1-2 lb/week wt loss to a wt loss goal of 6-24 lb at graduation from Cardiac Rehab     Intervention Plan   Intervention Prescribe, educate and counsel regarding individualized specific dietary  modifications aiming towards targeted core components such as weight, hypertension, lipid management, diabetes, heart failure and other comorbidities.   Expected Outcomes Short Term Goal: Understand basic principles of dietary content, such as calories, fat, sodium, cholesterol and nutrients.;Long Term Goal: Adherence to prescribed nutrition plan.      Nutrition Discharge:     Nutrition Assessments - 02/24/16 1157      MEDFICTS Scores   Pre Score 56      Education Questionnaire Score:     Knowledge Questionnaire Score - 02/18/16 1509      Knowledge Questionnaire Score   Pre Score 23/28      Jeray last attended cardiac rehab on 03/18/2016. Will discharge from cardiac rehab at this time.Gladstone Lighter, RN,BSN 05/03/2016 12:36 PM

## 2016-04-15 ENCOUNTER — Ambulatory Visit (HOSPITAL_COMMUNITY): Payer: BLUE CROSS/BLUE SHIELD

## 2016-04-15 ENCOUNTER — Encounter (HOSPITAL_COMMUNITY): Payer: BLUE CROSS/BLUE SHIELD

## 2016-04-18 ENCOUNTER — Ambulatory Visit (HOSPITAL_COMMUNITY): Payer: BLUE CROSS/BLUE SHIELD

## 2016-04-18 ENCOUNTER — Encounter (HOSPITAL_COMMUNITY): Payer: BLUE CROSS/BLUE SHIELD

## 2016-04-20 ENCOUNTER — Encounter (HOSPITAL_COMMUNITY): Payer: BLUE CROSS/BLUE SHIELD

## 2016-04-20 ENCOUNTER — Ambulatory Visit (HOSPITAL_COMMUNITY): Payer: BLUE CROSS/BLUE SHIELD

## 2016-04-22 ENCOUNTER — Ambulatory Visit (HOSPITAL_COMMUNITY): Payer: BLUE CROSS/BLUE SHIELD

## 2016-04-22 ENCOUNTER — Encounter (HOSPITAL_COMMUNITY): Payer: BLUE CROSS/BLUE SHIELD

## 2016-04-25 ENCOUNTER — Ambulatory Visit (HOSPITAL_COMMUNITY): Payer: BLUE CROSS/BLUE SHIELD

## 2016-04-25 ENCOUNTER — Encounter (HOSPITAL_COMMUNITY): Payer: BLUE CROSS/BLUE SHIELD

## 2016-04-27 ENCOUNTER — Ambulatory Visit (HOSPITAL_COMMUNITY): Payer: BLUE CROSS/BLUE SHIELD

## 2016-04-27 ENCOUNTER — Encounter (HOSPITAL_COMMUNITY): Payer: BLUE CROSS/BLUE SHIELD

## 2016-04-28 ENCOUNTER — Other Ambulatory Visit (HOSPITAL_COMMUNITY): Payer: BLUE CROSS/BLUE SHIELD

## 2016-04-29 ENCOUNTER — Encounter (HOSPITAL_COMMUNITY): Payer: BLUE CROSS/BLUE SHIELD

## 2016-04-29 ENCOUNTER — Ambulatory Visit (HOSPITAL_COMMUNITY): Payer: BLUE CROSS/BLUE SHIELD

## 2016-05-02 ENCOUNTER — Ambulatory Visit (HOSPITAL_COMMUNITY): Payer: BLUE CROSS/BLUE SHIELD

## 2016-05-02 ENCOUNTER — Encounter (HOSPITAL_COMMUNITY): Payer: BLUE CROSS/BLUE SHIELD

## 2016-05-04 ENCOUNTER — Encounter (HOSPITAL_COMMUNITY): Payer: BLUE CROSS/BLUE SHIELD

## 2016-05-04 ENCOUNTER — Ambulatory Visit (HOSPITAL_COMMUNITY): Payer: BLUE CROSS/BLUE SHIELD

## 2016-05-06 ENCOUNTER — Encounter (HOSPITAL_COMMUNITY): Payer: BLUE CROSS/BLUE SHIELD

## 2016-05-06 ENCOUNTER — Ambulatory Visit (HOSPITAL_COMMUNITY): Payer: BLUE CROSS/BLUE SHIELD

## 2016-05-09 ENCOUNTER — Encounter (HOSPITAL_COMMUNITY): Payer: BLUE CROSS/BLUE SHIELD

## 2016-05-09 ENCOUNTER — Ambulatory Visit (HOSPITAL_COMMUNITY): Payer: BLUE CROSS/BLUE SHIELD

## 2016-05-11 ENCOUNTER — Encounter (HOSPITAL_COMMUNITY): Payer: BLUE CROSS/BLUE SHIELD

## 2016-05-11 ENCOUNTER — Ambulatory Visit (HOSPITAL_COMMUNITY): Payer: BLUE CROSS/BLUE SHIELD

## 2016-05-13 ENCOUNTER — Encounter (HOSPITAL_COMMUNITY): Payer: BLUE CROSS/BLUE SHIELD

## 2016-05-13 ENCOUNTER — Ambulatory Visit (HOSPITAL_COMMUNITY): Payer: BLUE CROSS/BLUE SHIELD

## 2016-05-16 ENCOUNTER — Ambulatory Visit (HOSPITAL_COMMUNITY): Payer: BLUE CROSS/BLUE SHIELD

## 2016-05-16 ENCOUNTER — Encounter (HOSPITAL_COMMUNITY): Payer: BLUE CROSS/BLUE SHIELD

## 2016-05-17 ENCOUNTER — Ambulatory Visit (HOSPITAL_COMMUNITY): Payer: BLUE CROSS/BLUE SHIELD | Attending: Cardiovascular Disease

## 2016-05-17 ENCOUNTER — Other Ambulatory Visit: Payer: Self-pay

## 2016-05-17 DIAGNOSIS — I25810 Atherosclerosis of coronary artery bypass graft(s) without angina pectoris: Secondary | ICD-10-CM | POA: Insufficient documentation

## 2016-05-18 ENCOUNTER — Encounter (HOSPITAL_COMMUNITY): Payer: BLUE CROSS/BLUE SHIELD

## 2016-05-18 ENCOUNTER — Ambulatory Visit (HOSPITAL_COMMUNITY): Payer: BLUE CROSS/BLUE SHIELD

## 2016-05-23 ENCOUNTER — Encounter (HOSPITAL_COMMUNITY): Payer: BLUE CROSS/BLUE SHIELD

## 2016-05-23 ENCOUNTER — Ambulatory Visit (HOSPITAL_COMMUNITY): Payer: BLUE CROSS/BLUE SHIELD

## 2016-05-25 ENCOUNTER — Ambulatory Visit (HOSPITAL_COMMUNITY): Payer: BLUE CROSS/BLUE SHIELD

## 2016-05-25 ENCOUNTER — Encounter (HOSPITAL_COMMUNITY): Payer: BLUE CROSS/BLUE SHIELD

## 2016-05-27 ENCOUNTER — Ambulatory Visit (HOSPITAL_COMMUNITY): Payer: BLUE CROSS/BLUE SHIELD

## 2016-05-27 ENCOUNTER — Encounter (HOSPITAL_COMMUNITY): Payer: BLUE CROSS/BLUE SHIELD

## 2016-05-30 ENCOUNTER — Encounter (HOSPITAL_COMMUNITY): Payer: BLUE CROSS/BLUE SHIELD

## 2016-05-30 ENCOUNTER — Ambulatory Visit (HOSPITAL_COMMUNITY): Payer: BLUE CROSS/BLUE SHIELD

## 2016-06-01 ENCOUNTER — Ambulatory Visit (HOSPITAL_COMMUNITY): Payer: BLUE CROSS/BLUE SHIELD

## 2016-06-01 ENCOUNTER — Encounter (HOSPITAL_COMMUNITY): Payer: BLUE CROSS/BLUE SHIELD

## 2016-06-01 ENCOUNTER — Encounter: Payer: Self-pay | Admitting: Interventional Cardiology

## 2016-06-02 ENCOUNTER — Other Ambulatory Visit: Payer: Self-pay

## 2016-06-02 ENCOUNTER — Telehealth: Payer: Self-pay

## 2016-06-02 MED ORDER — CLOPIDOGREL BISULFATE 75 MG PO TABS: ORAL_TABLET | ORAL | 6 refills | Status: DC

## 2016-06-02 MED ORDER — ATORVASTATIN CALCIUM 40 MG PO TABS: 40.0000 mg | ORAL_TABLET | Freq: Every day | ORAL | 1 refills | Status: DC

## 2016-06-02 MED ORDER — CLOPIDOGREL BISULFATE 75 MG PO TABS: 75.0000 mg | ORAL_TABLET | Freq: Every day | ORAL | 6 refills | Status: DC

## 2016-06-02 NOTE — Telephone Encounter (Signed)
**Note De-Identified Cassidee Deats Obfuscation** Start Plavix 300 mg x 1 and then 75 mg daily after that. Can decrease atorvastatin to 40 mg daily to see if that helps with cost for now.     JV  ----- Message -----  From: Loa SocksIvy M Martin, LPN  Sent: 40/9/811912/11/2015  3:10 PM  To: Corky CraftsJayadeep S Varanasi, MD, Lorelle FormosaPatricia M Bijal Siglin, LPN  Subject: Annell GreeningFW: Non-Urgent Medical Question               ----- Message -----  From: Lars Pinksarrell J Lorentz  Sent: 06/01/2016  3:04 PM  To: [facility] [address] Triage  Subject: Non-Urgent Medical Question             I recently lost my job and subsequently my insurance benefits. I am trying to refill my prescriptions, I can afford two of them now but the Brilinta is $400+, and I think the Atorvastatin is $168.00. My question, is there any temporary relief or programs available until I secure employment? FYI, I have enough Brilinta to last until Saturday morning and Atorvastatin until Sunday evening. I am picking up the other two prescriptions today, so I will be good on those for the next 30 days.

## 2016-06-02 NOTE — Telephone Encounter (Signed)
**Note De-Identified Hayden Ramirez Obfuscation** The pt is advised and he verbalized understanding and is in agreement with plan. [facility]

## 2016-06-03 ENCOUNTER — Ambulatory Visit (HOSPITAL_COMMUNITY): Payer: BLUE CROSS/BLUE SHIELD

## 2016-06-03 ENCOUNTER — Encounter (HOSPITAL_COMMUNITY): Payer: BLUE CROSS/BLUE SHIELD

## 2016-06-06 ENCOUNTER — Ambulatory Visit (HOSPITAL_COMMUNITY): Payer: BLUE CROSS/BLUE SHIELD

## 2016-06-06 ENCOUNTER — Encounter (HOSPITAL_COMMUNITY): Payer: BLUE CROSS/BLUE SHIELD

## 2016-06-08 ENCOUNTER — Ambulatory Visit (HOSPITAL_COMMUNITY): Payer: BLUE CROSS/BLUE SHIELD

## 2016-06-08 ENCOUNTER — Encounter (HOSPITAL_COMMUNITY): Payer: BLUE CROSS/BLUE SHIELD

## 2016-06-10 ENCOUNTER — Ambulatory Visit (HOSPITAL_COMMUNITY): Payer: BLUE CROSS/BLUE SHIELD

## 2016-06-10 ENCOUNTER — Encounter (HOSPITAL_COMMUNITY): Payer: BLUE CROSS/BLUE SHIELD

## 2016-06-13 ENCOUNTER — Ambulatory Visit (HOSPITAL_COMMUNITY): Payer: BLUE CROSS/BLUE SHIELD

## 2016-06-13 ENCOUNTER — Encounter (HOSPITAL_COMMUNITY): Payer: BLUE CROSS/BLUE SHIELD

## 2016-06-15 ENCOUNTER — Ambulatory Visit (HOSPITAL_COMMUNITY): Payer: BLUE CROSS/BLUE SHIELD

## 2016-06-15 ENCOUNTER — Encounter (HOSPITAL_COMMUNITY): Payer: BLUE CROSS/BLUE SHIELD

## 2016-06-17 ENCOUNTER — Ambulatory Visit (HOSPITAL_COMMUNITY): Payer: BLUE CROSS/BLUE SHIELD

## 2016-06-17 ENCOUNTER — Encounter (HOSPITAL_COMMUNITY): Payer: BLUE CROSS/BLUE SHIELD

## 2016-07-06 ENCOUNTER — Encounter: Payer: Self-pay | Admitting: Interventional Cardiology

## 2017-10-05 IMAGING — CR DG CHEST 1V PORT
1 series · 1 of 1 positions shown · non-contrast
Comparison: Chest radiograph dated 07/19/2012

CLINICAL DATA: 50-year-old male with chest pain

EXAM:
PORTABLE CHEST 1 VIEW

[ap portable]
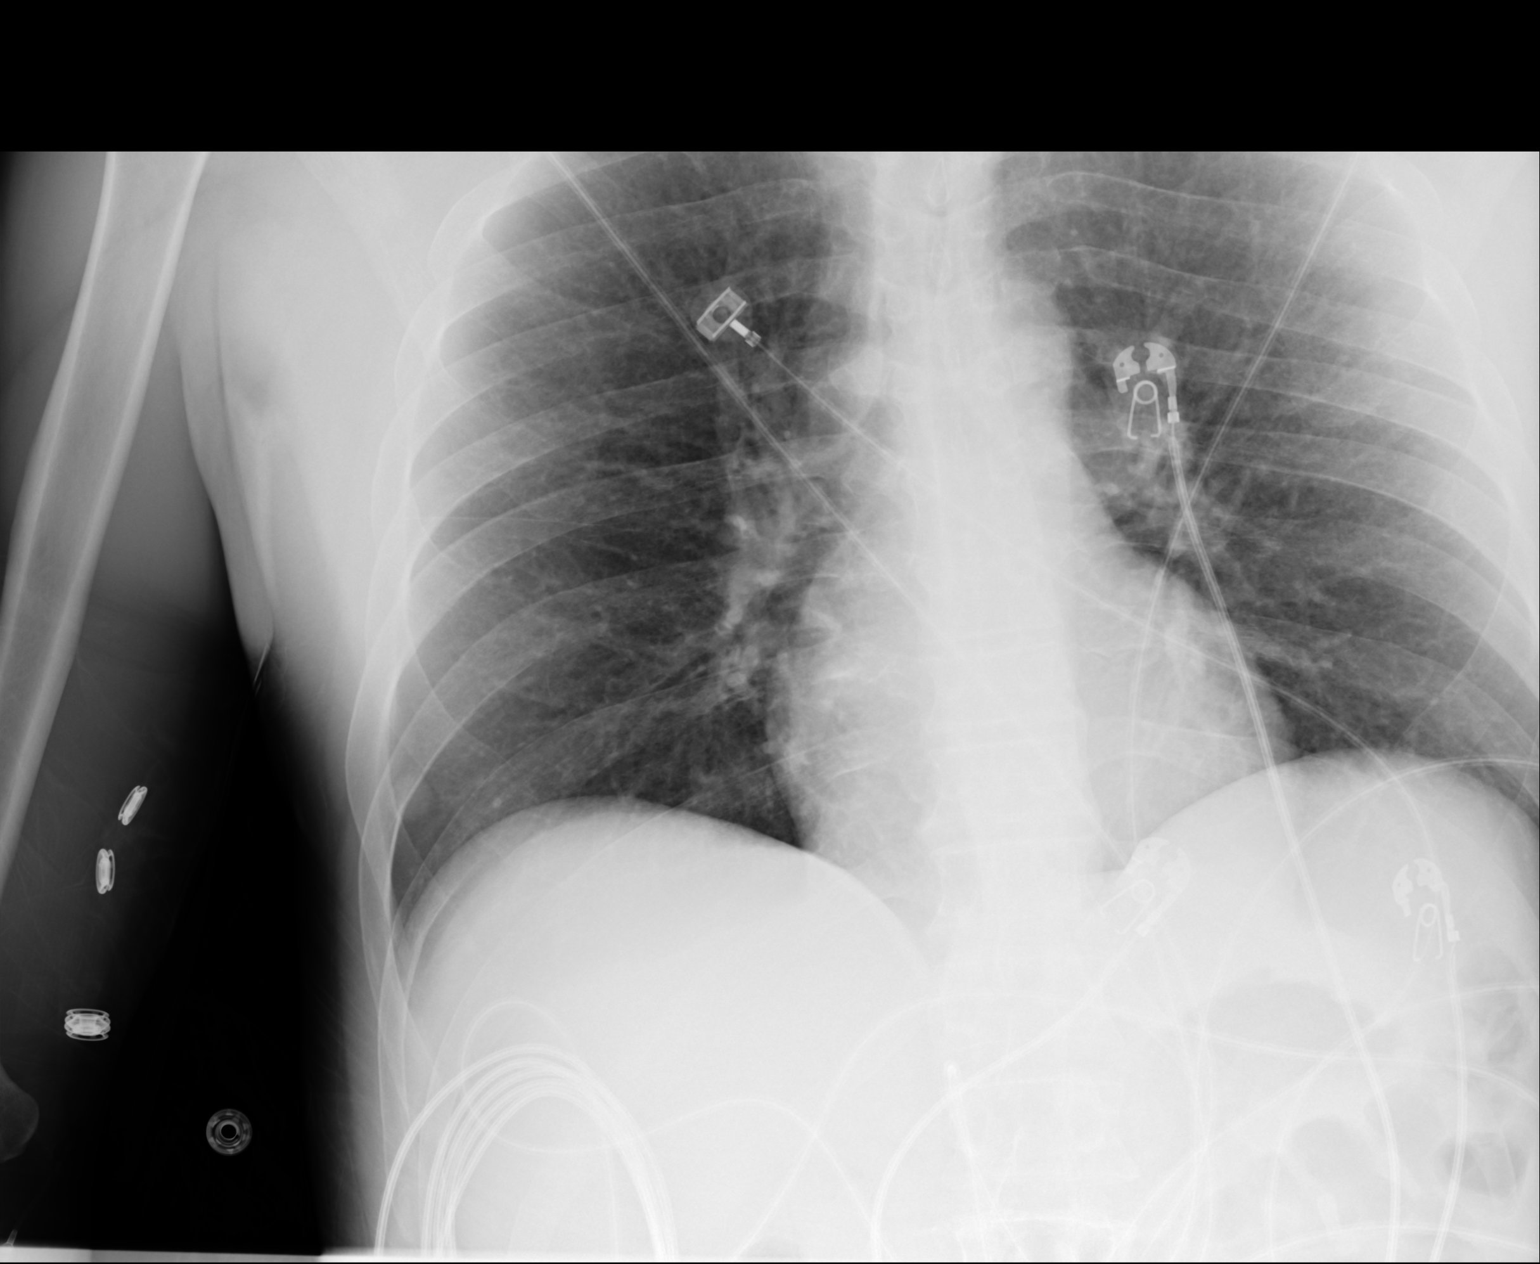

[1 of 1 positions shown; findings below may reference images not displayed]

FINDINGS: A stable 1 cm nodular density in at the right lung base likely
corresponds to nipple shadow. A pulmonary nodule is not excluded.
The lungs are otherwise clear. There is no pleural effusion or
pneumothorax. The left costophrenic angle has been excluded from the
image. The cardiac silhouette is within normal limits. No acute
osseous pathology.
IMPRESSION: No active disease.

## 2020-02-09 DIAGNOSIS — Z20822 Contact with and (suspected) exposure to covid-19: Secondary | ICD-10-CM | POA: Diagnosis not present

## 2021-11-20 ENCOUNTER — Emergency Department (HOSPITAL_COMMUNITY): Payer: BC Managed Care – PPO

## 2021-11-20 ENCOUNTER — Inpatient Hospital Stay (HOSPITAL_COMMUNITY): Payer: BC Managed Care – PPO

## 2021-11-20 ENCOUNTER — Other Ambulatory Visit (HOSPITAL_COMMUNITY): Payer: BLUE CROSS/BLUE SHIELD

## 2021-11-20 ENCOUNTER — Encounter (HOSPITAL_COMMUNITY)
Admission: EM | Disposition: A | Discharge status: Home Health | Payer: Self-pay | Source: Home / Self Care | Attending: Internal Medicine

## 2021-11-20 ENCOUNTER — Inpatient Hospital Stay (HOSPITAL_COMMUNITY)
Admission: EM | Admit: 2021-11-20 | Discharge: 2021-11-23 | DRG: 246 | Disposition: A | Discharge status: Home Health | Payer: BC Managed Care – PPO | Attending: Internal Medicine | Admitting: Internal Medicine

## 2021-11-20 DIAGNOSIS — I472 Ventricular tachycardia, unspecified: Secondary | ICD-10-CM | POA: Diagnosis not present

## 2021-11-20 DIAGNOSIS — Z720 Tobacco use: Secondary | ICD-10-CM | POA: Diagnosis present

## 2021-11-20 DIAGNOSIS — T82867A Thrombosis of cardiac prosthetic devices, implants and grafts, initial encounter: Principal | ICD-10-CM | POA: Diagnosis present

## 2021-11-20 DIAGNOSIS — Y831 Surgical operation with implant of artificial internal device as the cause of abnormal reaction of the patient, or of later complication, without mention of misadventure at the time of the procedure: Secondary | ICD-10-CM | POA: Diagnosis present

## 2021-11-20 DIAGNOSIS — I2119 ST elevation (STEMI) myocardial infarction involving other coronary artery of inferior wall: Secondary | ICD-10-CM | POA: Diagnosis present

## 2021-11-20 DIAGNOSIS — R531 Weakness: Secondary | ICD-10-CM | POA: Diagnosis not present

## 2021-11-20 DIAGNOSIS — I462 Cardiac arrest due to underlying cardiac condition: Secondary | ICD-10-CM | POA: Diagnosis not present

## 2021-11-20 DIAGNOSIS — J9690 Respiratory failure, unspecified, unspecified whether with hypoxia or hypercapnia: Secondary | ICD-10-CM | POA: Diagnosis not present

## 2021-11-20 DIAGNOSIS — F17210 Nicotine dependence, cigarettes, uncomplicated: Secondary | ICD-10-CM | POA: Diagnosis present

## 2021-11-20 DIAGNOSIS — Z79899 Other long term (current) drug therapy: Secondary | ICD-10-CM

## 2021-11-20 DIAGNOSIS — Z955 Presence of coronary angioplasty implant and graft: Secondary | ICD-10-CM

## 2021-11-20 DIAGNOSIS — Z4682 Encounter for fitting and adjustment of non-vascular catheter: Secondary | ICD-10-CM | POA: Diagnosis not present

## 2021-11-20 DIAGNOSIS — I213 ST elevation (STEMI) myocardial infarction of unspecified site: Secondary | ICD-10-CM | POA: Diagnosis present

## 2021-11-20 DIAGNOSIS — Z7902 Long term (current) use of antithrombotics/antiplatelets: Secondary | ICD-10-CM

## 2021-11-20 DIAGNOSIS — I255 Ischemic cardiomyopathy: Secondary | ICD-10-CM | POA: Diagnosis present

## 2021-11-20 DIAGNOSIS — Z20822 Contact with and (suspected) exposure to covid-19: Secondary | ICD-10-CM | POA: Diagnosis present

## 2021-11-20 DIAGNOSIS — E876 Hypokalemia: Secondary | ICD-10-CM | POA: Diagnosis not present

## 2021-11-20 DIAGNOSIS — E785 Hyperlipidemia, unspecified: Secondary | ICD-10-CM | POA: Diagnosis not present

## 2021-11-20 DIAGNOSIS — I2510 Atherosclerotic heart disease of native coronary artery without angina pectoris: Secondary | ICD-10-CM

## 2021-11-20 DIAGNOSIS — Z716 Tobacco abuse counseling: Secondary | ICD-10-CM | POA: Diagnosis not present

## 2021-11-20 DIAGNOSIS — J9601 Acute respiratory failure with hypoxia: Secondary | ICD-10-CM

## 2021-11-20 DIAGNOSIS — I4901 Ventricular fibrillation: Secondary | ICD-10-CM | POA: Diagnosis present

## 2021-11-20 DIAGNOSIS — I469 Cardiac arrest, cause unspecified: Secondary | ICD-10-CM

## 2021-11-20 DIAGNOSIS — I252 Old myocardial infarction: Secondary | ICD-10-CM | POA: Diagnosis not present

## 2021-11-20 DIAGNOSIS — I110 Hypertensive heart disease with heart failure: Secondary | ICD-10-CM | POA: Diagnosis not present

## 2021-11-20 DIAGNOSIS — I2121 ST elevation (STEMI) myocardial infarction involving left circumflex coronary artery: Secondary | ICD-10-CM | POA: Diagnosis not present

## 2021-11-20 DIAGNOSIS — Z781 Physical restraint status: Secondary | ICD-10-CM

## 2021-11-20 DIAGNOSIS — I5022 Chronic systolic (congestive) heart failure: Secondary | ICD-10-CM | POA: Diagnosis not present

## 2021-11-20 DIAGNOSIS — R4182 Altered mental status, unspecified: Secondary | ICD-10-CM | POA: Diagnosis not present

## 2021-11-20 DIAGNOSIS — I2102 ST elevation (STEMI) myocardial infarction involving left anterior descending coronary artery: Secondary | ICD-10-CM

## 2021-11-20 DIAGNOSIS — Z23 Encounter for immunization: Secondary | ICD-10-CM

## 2021-11-20 DIAGNOSIS — Z7982 Long term (current) use of aspirin: Secondary | ICD-10-CM | POA: Diagnosis not present

## 2021-11-20 DIAGNOSIS — J189 Pneumonia, unspecified organism: Secondary | ICD-10-CM | POA: Diagnosis not present

## 2021-11-20 HISTORY — PX: CORONARY STENT INTERVENTION: CATH118234

## 2021-11-20 HISTORY — PX: LEFT HEART CATH AND CORONARY ANGIOGRAPHY: CATH118249

## 2021-11-20 LAB — POCT I-STAT 7, (LYTES, BLD GAS, ICA,H+H)
Acid-Base Excess: 0 mmol/L (ref 0.0–2.0)
Acid-Base Excess: 1 mmol/L (ref 0.0–2.0)
Acid-Base Excess: 0 mmol/L (ref 0.0–2.0)
Acid-base deficit: 2 mmol/L (ref 0.0–2.0)
Bicarbonate: 26.2 mmol/L (ref 20.0–28.0)
Bicarbonate: 26.4 mmol/L (ref 20.0–28.0)
Bicarbonate: 27.3 mmol/L (ref 20.0–28.0)
Bicarbonate: 25.9 mmol/L (ref 20.0–28.0)
Calcium, Ion: 1.02 mmol/L — ABNORMAL LOW (ref 1.15–1.40)
Calcium, Ion: 1.06 mmol/L — ABNORMAL LOW (ref 1.15–1.40)
Calcium, Ion: 1.07 mmol/L — ABNORMAL LOW (ref 1.15–1.40)
Calcium, Ion: 1.11 mmol/L — ABNORMAL LOW (ref 1.15–1.40)
HCT: 40 % (ref 39.0–52.0)
HCT: 52 % (ref 39.0–52.0)
HCT: 52 % (ref 39.0–52.0)
HCT: 49 % (ref 39.0–52.0)
Hemoglobin: 13.6 g/dL (ref 13.0–17.0)
Hemoglobin: 17.7 g/dL — ABNORMAL HIGH (ref 13.0–17.0)
Hemoglobin: 17.7 g/dL — ABNORMAL HIGH (ref 13.0–17.0)
Hemoglobin: 16.7 g/dL (ref 13.0–17.0)
O2 Saturation: 96 %
O2 Saturation: 84 %
O2 Saturation: 89 %
O2 Saturation: 95 %
Patient temperature: 92.1
Patient temperature: 94.5
Patient temperature: 36.6
Potassium: 3.5 mmol/L (ref 3.5–5.1)
Potassium: 3.2 mmol/L — ABNORMAL LOW (ref 3.5–5.1)
Potassium: 3.3 mmol/L — ABNORMAL LOW (ref 3.5–5.1)
Potassium: 3.9 mmol/L (ref 3.5–5.1)
Sodium: 140 mmol/L (ref 135–145)
Sodium: 140 mmol/L (ref 135–145)
Sodium: 140 mmol/L (ref 135–145)
Sodium: 141 mmol/L (ref 135–145)
TCO2: 28 mmol/L (ref 22–32)
TCO2: 28 mmol/L (ref 22–32)
TCO2: 29 mmol/L (ref 22–32)
TCO2: 27 mmol/L (ref 22–32)
pCO2 arterial: 56.2 mmHg — ABNORMAL HIGH (ref 32–48)
pCO2 arterial: 40.7 mmHg (ref 32–48)
pCO2 arterial: 44.9 mmHg (ref 32–48)
pCO2 arterial: 46.5 mmHg (ref 32–48)
pH, Arterial: 7.277 — ABNORMAL LOW (ref 7.35–7.45)
pH, Arterial: 7.381 (ref 7.35–7.45)
pH, Arterial: 7.404 (ref 7.35–7.45)
pH, Arterial: 7.352 (ref 7.35–7.45)
pO2, Arterial: 94 mmHg (ref 83–108)
pO2, Arterial: 40 mmHg — CL (ref 83–108)
pO2, Arterial: 52 mmHg — ABNORMAL LOW (ref 83–108)
pO2, Arterial: 81 mmHg — ABNORMAL LOW (ref 83–108)

## 2021-11-20 LAB — CBC WITH DIFFERENTIAL/PLATELET
Abs Immature Granulocytes: 0.12 10*3/uL — ABNORMAL HIGH (ref 0.00–0.07)
Basophils Absolute: 0 10*3/uL (ref 0.0–0.1)
Basophils Relative: 1 %
Eosinophils Absolute: 0.1 10*3/uL (ref 0.0–0.5)
Eosinophils Relative: 1 %
HCT: 41.3 % (ref 39.0–52.0)
Hemoglobin: 12.9 g/dL — ABNORMAL LOW (ref 13.0–17.0)
Immature Granulocytes: 2 %
Lymphocytes Relative: 54 %
Lymphs Abs: 4.2 10*3/uL — ABNORMAL HIGH (ref 0.7–4.0)
MCH: 31.7 pg (ref 26.0–34.0)
MCHC: 31.2 g/dL (ref 30.0–36.0)
MCV: 101.5 fL — ABNORMAL HIGH (ref 80.0–100.0)
Monocytes Absolute: 0.5 10*3/uL (ref 0.1–1.0)
Monocytes Relative: 6 %
Neutro Abs: 2.7 10*3/uL (ref 1.7–7.7)
Neutrophils Relative %: 36 %
Platelets: 141 10*3/uL — ABNORMAL LOW (ref 150–400)
RBC: 4.07 MIL/uL — ABNORMAL LOW (ref 4.22–5.81)
RDW: 14.2 % (ref 11.5–15.5)
WBC: 7.6 10*3/uL (ref 4.0–10.5)
nRBC: 0.4 % — ABNORMAL HIGH (ref 0.0–0.2)

## 2021-11-20 LAB — HEMOGLOBIN A1C
Hgb A1c MFr Bld: 5.8 % — ABNORMAL HIGH (ref 4.8–5.6)
Mean Plasma Glucose: 119.76 mg/dL

## 2021-11-20 LAB — I-STAT CHEM 8, ED
BUN: 17 mg/dL (ref 6–20)
Calcium, Ion: 1 mmol/L — ABNORMAL LOW (ref 1.15–1.40)
Chloride: 107 mmol/L (ref 98–111)
Creatinine, Ser: 1.3 mg/dL — ABNORMAL HIGH (ref 0.61–1.24)
Glucose, Bld: 276 mg/dL — ABNORMAL HIGH (ref 70–99)
HCT: 39 % (ref 39.0–52.0)
Hemoglobin: 13.3 g/dL (ref 13.0–17.0)
Potassium: 2.6 mmol/L — CL (ref 3.5–5.1)
Sodium: 143 mmol/L (ref 135–145)
TCO2: 16 mmol/L — ABNORMAL LOW (ref 22–32)

## 2021-11-20 LAB — PROTIME-INR
INR: 1.5 — ABNORMAL HIGH (ref 0.8–1.2)
Prothrombin Time: 18 seconds — ABNORMAL HIGH (ref 11.4–15.2)

## 2021-11-20 LAB — COMPREHENSIVE METABOLIC PANEL
ALT: 134 U/L — ABNORMAL HIGH (ref 0–44)
AST: 170 U/L — ABNORMAL HIGH (ref 15–41)
Albumin: 2.5 g/dL — ABNORMAL LOW (ref 3.5–5.0)
Alkaline Phosphatase: 42 U/L (ref 38–126)
BUN: 16 mg/dL (ref 6–20)
CO2: <7 mmol/L — ABNORMAL LOW (ref 22–32)
Calcium: 7.3 mg/dL — ABNORMAL LOW (ref 8.9–10.3)
Chloride: 108 mmol/L (ref 98–111)
Creatinine, Ser: 1.62 mg/dL — ABNORMAL HIGH (ref 0.61–1.24)
GFR, Estimated: 50 mL/min — ABNORMAL LOW (ref >60)
Glucose, Bld: 287 mg/dL — ABNORMAL HIGH (ref 70–99)
Potassium: 2.7 mmol/L — CL (ref 3.5–5.1)
Sodium: 140 mmol/L (ref 135–145)
Total Bilirubin: 0.2 mg/dL — ABNORMAL LOW (ref 0.3–1.2)
Total Protein: 4.3 g/dL — ABNORMAL LOW (ref 6.5–8.1)

## 2021-11-20 LAB — POCT I-STAT, CHEM 8
BUN: 17 mg/dL (ref 6–20)
Calcium, Ion: 1.02 mmol/L — ABNORMAL LOW (ref 1.15–1.40)
Chloride: 104 mmol/L (ref 98–111)
Creatinine, Ser: 1.5 mg/dL — ABNORMAL HIGH (ref 0.61–1.24)
Glucose, Bld: 262 mg/dL — ABNORMAL HIGH (ref 70–99)
HCT: 42 % (ref 39.0–52.0)
Hemoglobin: 14.3 g/dL (ref 13.0–17.0)
Potassium: 3.4 mmol/L — ABNORMAL LOW (ref 3.5–5.1)
Sodium: 140 mmol/L (ref 135–145)
TCO2: 21 mmol/L — ABNORMAL LOW (ref 22–32)

## 2021-11-20 LAB — APTT: aPTT: 40 seconds — ABNORMAL HIGH (ref 24–36)

## 2021-11-20 LAB — LIPID PANEL
Cholesterol: 114 mg/dL (ref 0–200)
HDL: 28 mg/dL — ABNORMAL LOW (ref >40)
LDL Cholesterol: 67 mg/dL (ref 0–99)
Total CHOL/HDL Ratio: 4.1 RATIO
Triglycerides: 96 mg/dL (ref <150)
VLDL: 19 mg/dL (ref 0–40)

## 2021-11-20 LAB — CBC
HCT: 46.7 % (ref 39.0–52.0)
Hemoglobin: 16.3 g/dL (ref 13.0–17.0)
MCH: 31.3 pg (ref 26.0–34.0)
MCHC: 34.9 g/dL (ref 30.0–36.0)
MCV: 89.8 fL (ref 80.0–100.0)
Platelets: 255 10*3/uL (ref 150–400)
RBC: 5.2 MIL/uL (ref 4.22–5.81)
RDW: 14 % (ref 11.5–15.5)
WBC: 6.2 10*3/uL (ref 4.0–10.5)
nRBC: 0 % (ref 0.0–0.2)

## 2021-11-20 LAB — RESP PANEL BY RT-PCR (FLU A&B, COVID) ARPGX2
Influenza A by PCR: NEGATIVE
Influenza B by PCR: NEGATIVE
SARS Coronavirus 2 by RT PCR: NEGATIVE

## 2021-11-20 LAB — BASIC METABOLIC PANEL
Anion gap: 9 (ref 5–15)
BUN: 18 mg/dL (ref 6–20)
CO2: 27 mmol/L (ref 22–32)
Calcium: 8.3 mg/dL — ABNORMAL LOW (ref 8.9–10.3)
Chloride: 108 mmol/L (ref 98–111)
Creatinine, Ser: 1.43 mg/dL — ABNORMAL HIGH (ref 0.61–1.24)
GFR, Estimated: 59 mL/min — ABNORMAL LOW (ref >60)
Glucose, Bld: 120 mg/dL — ABNORMAL HIGH (ref 70–99)
Potassium: 4 mmol/L (ref 3.5–5.1)
Sodium: 144 mmol/L (ref 135–145)

## 2021-11-20 LAB — ECHOCARDIOGRAM COMPLETE: Weight: 2723.12 oz

## 2021-11-20 LAB — TROPONIN I (HIGH SENSITIVITY)
Troponin I (High Sensitivity): 29 ng/L — ABNORMAL HIGH (ref <18)
Troponin I (High Sensitivity): 13662 ng/L (ref <18)

## 2021-11-20 LAB — MAGNESIUM: Magnesium: 2.1 mg/dL (ref 1.7–2.4)

## 2021-11-20 LAB — POCT ACTIVATED CLOTTING TIME: Activated Clotting Time: 456 seconds

## 2021-11-20 LAB — MRSA NEXT GEN BY PCR, NASAL: MRSA by PCR Next Gen: NOT DETECTED

## 2021-11-20 SURGERY — LEFT HEART CATH AND CORONARY ANGIOGRAPHY
Anesthesia: LOCAL

## 2021-11-20 MED ORDER — ASPIRIN 81 MG PO CHEW
81.0000 mg | CHEWABLE_TABLET | Freq: Every day | ORAL | Status: DC
  Administered 2021-11-20: 81 mg
  Filled 2021-11-20: qty 1

## 2021-11-20 MED ORDER — BUSPIRONE HCL 10 MG PO TABS: 30.0000 mg | ORAL_TABLET | Freq: Three times a day (TID) | ORAL | Status: DC | PRN

## 2021-11-20 MED ORDER — HEPARIN (PORCINE) IN NACL 1000-0.9 UT/500ML-% IV SOLN
INTRAVENOUS | Status: DC | PRN
  Administered 2021-11-20 (×2): 500 mL

## 2021-11-20 MED ORDER — HEPARIN SODIUM (PORCINE) 1000 UNIT/ML IJ SOLN
INTRAMUSCULAR | Status: AC
  Filled 2021-11-20: qty 10

## 2021-11-20 MED ORDER — SODIUM CHLORIDE 0.9 % IV SOLN
4.0000 ug/kg/min | INTRAVENOUS | Status: DC
  Administered 2021-11-20 (×2): 4 ug/kg/min via INTRAVENOUS
  Filled 2021-11-20 (×3): qty 50

## 2021-11-20 MED ORDER — SODIUM CHLORIDE 0.9 % IV SOLN: INTRAVENOUS | Status: DC

## 2021-11-20 MED ORDER — PANTOPRAZOLE 2 MG/ML SUSPENSION
40.0000 mg | Freq: Every day | ORAL | Status: DC
  Administered 2021-11-20: 40 mg
  Filled 2021-11-20: qty 20

## 2021-11-20 MED ORDER — ACETAMINOPHEN 325 MG PO TABS: 650.0000 mg | ORAL_TABLET | ORAL | Status: DC | PRN

## 2021-11-20 MED ORDER — TICAGRELOR 90 MG PO TABS
90.0000 mg | ORAL_TABLET | Freq: Two times a day (BID) | ORAL | Status: DC
  Administered 2021-11-20 – 2021-11-23 (×6): 90 mg via ORAL
  Filled 2021-11-20 (×6): qty 1

## 2021-11-20 MED ORDER — AMIODARONE HCL IN DEXTROSE 360-4.14 MG/200ML-% IV SOLN
INTRAVENOUS | Status: AC
  Administered 2021-11-20: 150 mg via INTRAVENOUS
  Filled 2021-11-20: qty 200

## 2021-11-20 MED ORDER — POTASSIUM CHLORIDE 10 MEQ/100ML IV SOLN
10.0000 meq | INTRAVENOUS | Status: DC
  Filled 2021-11-20: qty 100

## 2021-11-20 MED ORDER — POTASSIUM CHLORIDE 20 MEQ PO PACK
40.0000 meq | PACK | Freq: Once | ORAL | Status: AC
  Administered 2021-11-20: 40 meq

## 2021-11-20 MED ORDER — POLYETHYLENE GLYCOL 3350 17 G PO PACK
17.0000 g | PACK | Freq: Every day | ORAL | Status: DC
  Administered 2021-11-20 – 2021-11-22 (×3): 17 g
  Filled 2021-11-20 (×3): qty 1

## 2021-11-20 MED ORDER — FUROSEMIDE 10 MG/ML IJ SOLN
INTRAMUSCULAR | Status: DC | PRN
  Administered 2021-11-20: 40 mg via INTRAVENOUS

## 2021-11-20 MED ORDER — ASPIRIN 81 MG PO TBEC: 81.0000 mg | DELAYED_RELEASE_TABLET | Freq: Every day | ORAL | Status: DC

## 2021-11-20 MED ORDER — MIDAZOLAM HCL 2 MG/2ML IJ SOLN
2.0000 mg | INTRAMUSCULAR | Status: DC | PRN
  Administered 2021-11-20 (×2): 2 mg via INTRAVENOUS
  Filled 2021-11-20 (×3): qty 2

## 2021-11-20 MED ORDER — IOHEXOL 350 MG/ML SOLN
INTRAVENOUS | Status: DC | PRN
  Administered 2021-11-20: 85 mL

## 2021-11-20 MED ORDER — ACETAMINOPHEN 650 MG RE SUPP: 650.0000 mg | RECTAL | Status: AC

## 2021-11-20 MED ORDER — AMIODARONE HCL IN DEXTROSE 360-4.14 MG/200ML-% IV SOLN
60.0000 mg/h | INTRAVENOUS | Status: DC
  Administered 2021-11-20: 60 mg/h via INTRAVENOUS
  Filled 2021-11-20: qty 200

## 2021-11-20 MED ORDER — ONDANSETRON HCL 4 MG/2ML IJ SOLN: 4.0000 mg | Freq: Four times a day (QID) | INTRAMUSCULAR | Status: DC | PRN

## 2021-11-20 MED ORDER — VERAPAMIL HCL 2.5 MG/ML IV SOLN
INTRAVENOUS | Status: DC | PRN
  Administered 2021-11-20: 10 mL via INTRA_ARTERIAL

## 2021-11-20 MED ORDER — VERAPAMIL HCL 2.5 MG/ML IV SOLN
INTRAVENOUS | Status: AC
  Filled 2021-11-20: qty 2

## 2021-11-20 MED ORDER — SODIUM BICARBONATE 8.4 % IV SOLN
INTRAVENOUS | Status: DC | PRN
  Administered 2021-11-20 (×2): 100 meq via INTRAVENOUS

## 2021-11-20 MED ORDER — CHLORHEXIDINE GLUCONATE CLOTH 2 % EX PADS
6.0000 | MEDICATED_PAD | Freq: Every day | CUTANEOUS | Status: DC
  Administered 2021-11-20 – 2021-11-23 (×4): 6 via TOPICAL

## 2021-11-20 MED ORDER — SODIUM CHLORIDE 0.9% FLUSH
3.0000 mL | Freq: Two times a day (BID) | INTRAVENOUS | Status: DC
  Administered 2021-11-20 – 2021-11-23 (×6): 3 mL via INTRAVENOUS

## 2021-11-20 MED ORDER — SODIUM BICARBONATE 8.4 % IV SOLN
INTRAVENOUS | Status: AC
  Filled 2021-11-20: qty 100

## 2021-11-20 MED ORDER — HEPARIN (PORCINE) IN NACL 1000-0.9 UT/500ML-% IV SOLN
INTRAVENOUS | Status: AC
  Filled 2021-11-20: qty 1000

## 2021-11-20 MED ORDER — LIDOCAINE HCL (PF) 1 % IJ SOLN
INTRAMUSCULAR | Status: DC | PRN
  Administered 2021-11-20: 2 mL via INTRADERMAL

## 2021-11-20 MED ORDER — ACETAMINOPHEN 160 MG/5ML PO SOLN
650.0000 mg | ORAL | Status: DC | PRN
Start: 2021-11-21 — End: 2021-11-23
  Administered 2021-11-20: 650 mg

## 2021-11-20 MED ORDER — DOCUSATE SODIUM 50 MG/5ML PO LIQD
100.0000 mg | Freq: Two times a day (BID) | ORAL | Status: DC
  Administered 2021-11-20: 100 mg
  Filled 2021-11-20: qty 10

## 2021-11-20 MED ORDER — FENTANYL CITRATE PF 50 MCG/ML IJ SOSY
50.0000 ug | PREFILLED_SYRINGE | INTRAMUSCULAR | Status: DC | PRN
  Administered 2021-11-20: 100 ug via INTRAVENOUS
  Administered 2021-11-20: 50 ug via INTRAVENOUS
  Administered 2021-11-20 (×3): 100 ug via INTRAVENOUS
  Administered 2021-11-20 (×2): 50 ug via INTRAVENOUS
  Administered 2021-11-20: 100 ug via INTRAVENOUS
  Filled 2021-11-20 (×2): qty 2
  Filled 2021-11-20: qty 1
  Filled 2021-11-20 (×3): qty 2
  Filled 2021-11-20 (×2): qty 1

## 2021-11-20 MED ORDER — HYDRALAZINE HCL 20 MG/ML IJ SOLN: 10.0000 mg | INTRAMUSCULAR | Status: AC | PRN

## 2021-11-20 MED ORDER — SODIUM CHLORIDE 0.9% FLUSH: 3.0000 mL | INTRAVENOUS | Status: DC | PRN

## 2021-11-20 MED ORDER — SODIUM CHLORIDE 0.9 % IV SOLN: 250.0000 mL | INTRAVENOUS | Status: DC | PRN

## 2021-11-20 MED ORDER — DOCUSATE SODIUM 50 MG/5ML PO LIQD: 100.0000 mg | Freq: Two times a day (BID) | ORAL | Status: DC

## 2021-11-20 MED ORDER — CANGRELOR TETRASODIUM 50 MG IV SOLR
INTRAVENOUS | Status: AC
  Filled 2021-11-20: qty 50

## 2021-11-20 MED ORDER — POTASSIUM CHLORIDE CRYS ER 20 MEQ PO TBCR
40.0000 meq | EXTENDED_RELEASE_TABLET | Freq: Once | ORAL | Status: DC
  Filled 2021-11-20: qty 2

## 2021-11-20 MED ORDER — POLYETHYLENE GLYCOL 3350 17 G PO PACK: 17.0000 g | PACK | Freq: Every day | ORAL | Status: DC

## 2021-11-20 MED ORDER — TICAGRELOR 90 MG PO TABS
180.0000 mg | ORAL_TABLET | Freq: Once | ORAL | Status: AC
  Administered 2021-11-20: 180 mg
  Filled 2021-11-20: qty 2

## 2021-11-20 MED ORDER — AMIODARONE LOAD VIA INFUSION
150.0000 mg | Freq: Once | INTRAVENOUS | Status: AC
Start: 2021-11-20 — End: 2021-11-20

## 2021-11-20 MED ORDER — ACETAMINOPHEN 325 MG PO TABS
650.0000 mg | ORAL_TABLET | ORAL | Status: DC | PRN
  Administered 2021-11-22: 650 mg via ORAL
  Filled 2021-11-20: qty 2

## 2021-11-20 MED ORDER — POTASSIUM CHLORIDE 20 MEQ PO PACK
PACK | ORAL | Status: DC | PRN
  Administered 2021-11-20: 40 meq

## 2021-11-20 MED ORDER — ACETAMINOPHEN 160 MG/5ML PO SOLN
650.0000 mg | ORAL | Status: AC
  Administered 2021-11-20 (×2): 650 mg
  Filled 2021-11-20 (×3): qty 20.3

## 2021-11-20 MED ORDER — LIDOCAINE HCL (PF) 1 % IJ SOLN
INTRAMUSCULAR | Status: AC
  Filled 2021-11-20: qty 30

## 2021-11-20 MED ORDER — FUROSEMIDE 10 MG/ML IJ SOLN
INTRAMUSCULAR | Status: AC
  Filled 2021-11-20: qty 4

## 2021-11-20 MED ORDER — LABETALOL HCL 5 MG/ML IV SOLN: 10.0000 mg | INTRAVENOUS | Status: AC | PRN

## 2021-11-20 MED ORDER — MIDAZOLAM HCL 2 MG/2ML IJ SOLN: 2.0000 mg | INTRAMUSCULAR | Status: DC | PRN

## 2021-11-20 MED ORDER — LIDOCAINE HCL (PF) 1 % IJ SOLN
INTRAMUSCULAR | Status: AC
  Filled 2021-11-20: qty 60

## 2021-11-20 MED ORDER — ACETAMINOPHEN 325 MG PO TABS
650.0000 mg | ORAL_TABLET | ORAL | Status: AC
  Administered 2021-11-20 – 2021-11-21 (×5): 650 mg via ORAL
  Filled 2021-11-20 (×5): qty 2

## 2021-11-20 MED ORDER — ACETAMINOPHEN 650 MG RE SUPP: 650.0000 mg | RECTAL | Status: DC | PRN

## 2021-11-20 MED ORDER — CANGRELOR BOLUS VIA INFUSION
INTRAVENOUS | Status: DC | PRN
  Administered 2021-11-20: 2448 ug via INTRAVENOUS

## 2021-11-20 MED ORDER — HEPARIN SODIUM (PORCINE) 1000 UNIT/ML IJ SOLN
INTRAMUSCULAR | Status: DC | PRN
  Administered 2021-11-20: 4000 [IU] via INTRAVENOUS
  Administered 2021-11-20: 5000 [IU] via INTRAVENOUS

## 2021-11-20 MED ORDER — SODIUM CHLORIDE 0.9 % IV SOLN
INTRAVENOUS | Status: AC | PRN
  Administered 2021-11-20: 4 ug/kg/min via INTRAVENOUS

## 2021-11-20 MED ORDER — ATORVASTATIN CALCIUM 80 MG PO TABS
80.0000 mg | ORAL_TABLET | Freq: Every day | ORAL | Status: DC
  Administered 2021-11-20 – 2021-11-23 (×4): 80 mg via ORAL
  Filled 2021-11-20 (×4): qty 1

## 2021-11-20 MED ORDER — POTASSIUM CHLORIDE CRYS ER 20 MEQ PO TBCR
EXTENDED_RELEASE_TABLET | ORAL | Status: AC
  Filled 2021-11-20: qty 2

## 2021-11-20 MED ORDER — AMIODARONE HCL IN DEXTROSE 360-4.14 MG/200ML-% IV SOLN
30.0000 mg/h | INTRAVENOUS | Status: DC
  Administered 2021-11-20 (×2): 30 mg/h via INTRAVENOUS
  Filled 2021-11-20 (×2): qty 200

## 2021-11-20 MED ORDER — DEXMEDETOMIDINE HCL IN NACL 400 MCG/100ML IV SOLN
0.4000 ug/kg/h | INTRAVENOUS | Status: DC
  Administered 2021-11-20: 0.5 ug/kg/h via INTRAVENOUS

## 2021-11-20 SURGICAL SUPPLY — 20 items
BALL SAPPHIRE NC24 3.25X18 (BALLOONS) ×2
BALLN SAPPHIRE 2.5X12 (BALLOONS) ×2
BALLOON SAPPHIRE 2.5X12 (BALLOONS) IMPLANT
BALLOON SAPPHIRE NC24 3.25X18 (BALLOONS) IMPLANT
BAND ZEPHYR COMPRESS 30 LONG (HEMOSTASIS) ×1 IMPLANT
CATH INFINITI 6F FL3.5 (CATHETERS) ×1 IMPLANT
CATH LAUNCHER 6FR EBU3.5 (CATHETERS) ×1 IMPLANT
CATH VISTA GUIDE 6FR JR4 (CATHETERS) ×1 IMPLANT
GLIDESHEATH SLEND SS 6F .021 (SHEATH) ×1 IMPLANT
GUIDEWIRE INQWIRE 1.5J.035X260 (WIRE) IMPLANT
GUIDEWIRE VAS SION BLUE 190 (WIRE) ×1 IMPLANT
INQWIRE 1.5J .035X260CM (WIRE) ×2
KIT ENCORE 26 ADVANTAGE (KITS) ×1 IMPLANT
KIT HEART LEFT (KITS) ×2 IMPLANT
PACK CARDIAC CATHETERIZATION (CUSTOM PROCEDURE TRAY) ×2 IMPLANT
STENT SYNERGY XD 3.0X38 (Permanent Stent) IMPLANT
SYNERGY XD 3.0X38 (Permanent Stent) ×2 IMPLANT
SYR MEDRAD MARK 7 150ML (SYRINGE) ×2 IMPLANT
TRANSDUCER W/STOPCOCK (MISCELLANEOUS) ×2 IMPLANT
TUBING CIL FLEX 10 FLL-RA (TUBING) ×2 IMPLANT

## 2021-11-20 NOTE — Procedures (Signed)
Routine EEG Report  Hayden Ramirez is a 56 y.o. male with a history of cardiac arrest who is undergoing an EEG to evaluate for seizures.  Report: This EEG was acquired with electrodes placed according to the International 10-20 electrode system (including Fp1, Fp2, F3, F4, C3, C4, P3, P4, O1, O2, T3, T4, T5, T6, A1, A2, Fz, Cz, Pz). The following electrodes were missing or displaced: none.  The best background was continuous activity at 7 Hz. This activity is reactive to stimulation. Sleep architecture was identified by K complexes and sleep spindles. There was no focal slowing. There were no interictal epileptiform discharges. There were no electrographic seizures identified. Photic stimulation and hyperventilation were not performed.   Impression and clinical correlation: This EEG was obtained while sedated on fentanyl and is abnormal due to mild diffuse slowing indicative of global cerebral dysfunction. Epileptiform abnormalities were not seen during this recording.  Su Monks, MD [facility] [phone]  If 7pm- 7am, please page neurology on call as listed in [place].

## 2021-11-20 NOTE — Progress Notes (Signed)
11/20/21 0815  Clinical Encounter Type  Visited With Family;Health care provider  Visit Type Follow-up;Spiritual support;Post-op;Critical Care  Referral From Chaplain Melvenia Beam)  Consult/Referral To Chaplain  Spiritual Encounters  Spiritual Needs Emotional;Grief support   Met family of Hayden Ramirez in the [place] and will introduce day Chaplain to family. [address] [place], [name] [phone]

## 2021-11-20 NOTE — ED Provider Notes (Signed)
[facility] EMERGENCY DEPARTMENT Provider Note  CSN: [id number] Arrival date & time: 11/20/21 0145  Chief Complaint(s) Cardiac Arrest  HPI Hayden Ramirez is a 56 y.o. male with a past medical history listed below including hypertension, hyperlipidemia, prior STEMI in 2017 status post stenting who presents to the ED by POV for back tightness.  He is accompanied by the son, who dropped off the patient.  While in triage the patient became unresponsive and was noted to be in cardiac arrest.  He was immediately brought back to resuscitation bay and ACLS was initiated. There was a report that the patient might have had seizure-like activity, and might had have hit his head.   Cardiac Arrest  Past Medical History Past Medical History:  Diagnosis Date   CAD (coronary artery disease)    a. 01/2016: STEMI w/ 100% stenosis 1st Mrg (DES placed) and 90% stenosis mid-RCA (DES placed).    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 Authorizing Provider  aspirin 81 MG chewable tablet Chew 1 tablet (81 mg total) by mouth daily. 02/02/16   Strader, Fransisco Hertz, PA-C  atorvastatin (LIPITOR) 40 MG tablet Take 1 tablet (40 mg total) by mouth daily. 06/02/16 08/31/16  Jettie Booze, MD  clopidogrel (PLAVIX) 75 MG tablet Take 4 tablets (300 mg) on the first day then decrease to 1 tablet (75 mg) daily there after 06/02/16   Jettie Booze, MD  losartan (COZAAR) 25 MG tablet Take 1 tablet (25 mg total) by mouth daily. 02/02/16   Strader, Fransisco Hertz, PA-C   metoprolol tartrate (LOPRESSOR) 25 MG tablet Take 0.5 tablets (12.5 mg total) by mouth 2 (two) times daily. 02/02/16   Strader, Fransisco Hertz, PA-C  nicotine (NICODERM CQ - DOSED IN MG/24 HOURS) 21 mg/24hr patch Place 1 patch (21 mg total) onto the skin daily. 03/28/16   Jettie Booze, MD  nitroGLYCERIN (NITRODUR - DOSED IN MG/24 HR) 0.4 mg/hr patch Place 0.4 mg onto the skin daily. 3 DOSES MAX    [provider]  OVER THE COUNTER MEDICATION Take 2 capsules by mouth daily. Platinum XT - Testosterone Booster    [provider]                                                                                                                                    Allergies Patient has no known allergies.  Review of Systems Review of Systems As noted in HPI  Physical Exam  Vital Signs  I have reviewed the triage vital signs BP (!) 121/96   Pulse (!) 143   Resp 20   SpO2 (!) 79%   Physical Exam Vitals reviewed.  Constitutional:      General: He is in acute distress.     Appearance: He is well-developed. He is toxic-appearing. He is not diaphoretic.  HENT:     Head: Normocephalic and atraumatic.     Nose: Nose normal.  Eyes:     General: No scleral icterus.       Right eye: No discharge.        Left eye: No discharge.     Conjunctiva/sclera: Conjunctivae normal.     Pupils: Pupils are equal, round, and reactive to light.  Cardiovascular:     Heart sounds: No murmur heard.   No friction rub. No gallop.     Comments: Pulseless  Pulmonary:     Effort: Pulmonary effort is normal. No respiratory distress.     Breath sounds: Normal breath sounds. No stridor. No rales.  Abdominal:     General: There is no distension.     Palpations: Abdomen is soft.     Tenderness: There is no abdominal tenderness.  Musculoskeletal:        General: No tenderness.     Cervical back: Normal range of motion and neck supple.  Skin:    General: Skin is warm and dry.     Findings: No  erythema or rash.  Neurological:     Mental Status: He is unresponsive.    ED Results and Treatments Labs (all labs ordered are listed, but only abnormal results are displayed) Labs Reviewed  RESP PANEL BY RT-PCR (FLU A&B, COVID) ARPGX2  HEMOGLOBIN A1C  CBC WITH DIFFERENTIAL/PLATELET  PROTIME-INR  APTT  COMPREHENSIVE METABOLIC PANEL  LIPID PANEL  MAGNESIUM  I-STAT CHEM 8, ED  TROPONIN I (HIGH SENSITIVITY)                                                                                                                         EKG    Radiology CT Head Wo Contrast  Result Date: 11/20/2021 CLINICAL DATA:  Head trauma, abnormal mental status EXAM: CT HEAD WITHOUT CONTRAST TECHNIQUE: Contiguous axial images were obtained from the base of the skull through the vertex without intravenous contrast. RADIATION DOSE REDUCTION: This exam was performed according to the departmental dose-optimization program which includes automated exposure control, adjustment of the mA and/or kV according to patient size and/or use of iterative reconstruction technique. COMPARISON:  None Available. FINDINGS: Brain: No evidence of acute infarction, hemorrhage, cerebral edema, mass, mass effect, or midline shift. No hydrocephalus or extra-axial fluid collection. Hypodensity in the right inferior frontal lobe is consistent with the sequela of a remote infarct. Vascular: No hyperdense vessel. Skull: Normal. Negative for fracture or focal lesion. Sinuses/Orbits: Mucous retention cysts in the right maxillary sinus and frontal sinus. Mucosal thickening in the ethmoid air cells and right sphenoid  sinus. Other: The mastoid air cells are well aerated. Fluid in the nasal cavity is likely related to the patient being intubated. IMPRESSION: No acute intracranial process. Electronically Signed   By: Merilyn Baba M.D.   On: 11/20/2021 02:47   DG Chest Portable 1 View  Result Date: 11/20/2021 CLINICAL DATA:  ET tube placement EXAM:  PORTABLE CHEST 1 VIEW COMPARISON:  01/30/2016 FINDINGS: Endotracheal tube is 6 cm above the carina. NG tube enters the stomach. Heart is normal size. No confluent opacities or effusions. IMPRESSION: Endotracheal tube 6 cm above the carina. No acute cardiopulmonary disease. Electronically Signed   By: Rolm Baptise M.D.   On: 11/20/2021 02:33   DG Abd Portable 1 View  Result Date: 11/20/2021 CLINICAL DATA:  Cardiac arrest, NG tube. EXAM: PORTABLE ABDOMEN - 1 VIEW COMPARISON:  None Available. FINDINGS: The bowel gas pattern is normal. An enteric tube terminates in the stomach. No radio-opaque calculi or other significant radiographic abnormality are seen. IMPRESSION: Enteric tube terminates in the stomach. Electronically Signed   By: Brett Fairy M.D.   On: 11/20/2021 02:35    Pertinent labs & imaging results that were available during my care of the patient were reviewed by me and considered in my medical decision making (see MDM for details).  Medications Ordered in ED Medications  0.9 %  sodium chloride infusion (has no administration in time range)  amiodarone (NEXTERONE) 1.8 mg/mL load via infusion 150 mg (150 mg Intravenous Bolus from Bag 11/20/21 0226)    Followed by  amiodarone (NEXTERONE PREMIX) 360-4.14 MG/200ML-% (1.8 mg/mL) IV infusion (has no administration in time range)    Followed by  amiodarone (NEXTERONE PREMIX) 360-4.14 MG/200ML-% (1.8 mg/mL) IV infusion (has no administration in time range)                                                                                                                                     Procedures Procedure Name: Intubation Date/Time: 11/20/2021 3:45 AM Performed by: Fatima Blank, MD Pre-anesthesia Checklist: Patient identified, Patient being monitored, Emergency Drugs available, Timeout performed and Suction available Oxygen Delivery Method: Non-rebreather mask Preoxygenation: Pre-oxygenation with 100% oxygen Induction Type: Rapid  sequence Ventilation: Mask ventilation without difficulty Laryngoscope Size: Glidescope Grade View: Grade I Tube size: 7.5 mm Number of attempts: 1 Airway Equipment and Method: Rigid stylet Placement Confirmation: ETT inserted through vocal cords under direct vision, CO2 detector and Breath sounds checked- equal and bilateral Secured at: 27 cm Tube secured with: ETT holder    CPR  Date/Time: 11/20/2021 3:47 AM Performed by: Fatima Blank, MD Authorized by: Fatima Blank, MD  CPR Procedure Details:      Amount of time prior to administration of ACLS/BLS (minutes):  1   CPR/ACLS performed in the ED: Yes     Duration of CPR (minutes):  10   Outcome: ROSC obtained    CPR performed via  ACLS guidelines under my direct supervision.  See RN documentation for details including defibrillator use, medications, doses and timing. .Critical Care Performed by: Fatima Blank, MD Authorized by: Fatima Blank, MD   Critical care provider statement:    Critical care time (minutes):  45   Critical care time was exclusive of:  Separately billable procedures and treating other patients   Critical care was necessary to treat or prevent imminent or life-threatening deterioration of the following conditions:  Cardiac failure   Critical care was time spent personally by me on the following activities:  Development of treatment plan with patient or surrogate, discussions with consultants, evaluation of patient's response to treatment, examination of patient, obtaining history from patient or surrogate, review of old charts, re-evaluation of patient's condition, pulse oximetry, ordering and review of radiographic studies, ordering and review of laboratory studies and ordering and performing treatments and interventions   Care discussed with: admitting provider    (including critical care time)  Medical Decision Making / ED Course    Complexity of  Problem:  Co-morbidities/SDOH that complicate the patient evaluation/care: HTN, prior MI, smoker  Additional history obtained: son  Patient's presenting problem/concern, DDX, and MDM listed below: Unresponsiveness found to be in cardiac arrest.   Hospitalization Considered:  yes  Initial Intervention:  ACSL    Complexity of Data:   Cardiac Monitoring: The patient was maintained on a cardiac monitor.   I personally viewed and interpreted the cardiac monitored which showed an underlying rhythm of initially v-fib into [facility]   Laboratory Tests ordered listed below with my independent interpretation: CBC without leukocytosis or anemia. Chem-8 with hypokalemia at 2.6. this was noted after patient went to the cath lab.   Imaging Studies ordered listed below with my independent interpretation: Chest x-ray without evidence of pneumonia, pneumothorax, widened mediastinum CT head negative for ICH     ED Course:    Assessment, Add'l Intervention, and Reassessment: ACLS was initiated Noted to be in V-fib.  He required multiple rounds of epinephrine, 2 cardioversions.  He also received bicarb. ROSC was obtained.  Subsequent EKG notable for ST segment elevation in the inferior lateral leads.  Code STEMI was initiated. Given his reported possible head trauma, CT head was obtained prior to taking patient to the Cath Lab.  This was negative for ICH. Cardiology evaluated the patient in the emergency department and agreed to take the patient to the Cath Lab.    Final Clinical Impression(s) / ED Diagnoses Final diagnoses:  Cardiac arrest [facility])           This chart was dictated using voice recognition software.  Despite best efforts to proofread,  errors can occur which can change the documentation meaning.    Fatima Blank, MD 11/20/21 [phone]

## 2021-11-20 NOTE — Progress Notes (Signed)
CARDIAC REHAB PHASE I   Pt not appropriate for education at this time. Recently extubated getting echo done. MI materials left at bedside. Will refer to CRP II [place] and f/u as appropriate.  [id number] Reynold Bowen, RN BSN 11/20/2021 1:46 PM

## 2021-11-20 NOTE — Progress Notes (Signed)
   11/20/21 0209  Clinical Encounter Type  Visited With Patient not available;Health care provider  Visit Type ED;Code;Critical Care;Pre-op;Psychological support;Initial  Referral From Nurse  Consult/Referral To Chaplain Melvenia Beam)  Spiritual Encounters  Spiritual Needs Emotional;Grief support   0205: Call from E.D. for Emotional and Grief Support for family of Jayziah Bankhead. Maricela Bo in E.D. Trauma Room A. Upon arrival met with with Mr. Marylin Crosby, the adult son of Mr. Swire. Tyreik shared that his father awoke him around 0100 stating that he did not feel good and asked him to bring him to [facility] Upon arrival to E.D. Mr. Navarrete suffered witnessed cardiac arrest in the E.D. Lobby and Resuscitation efforts were initiated by medical staff, and patient moved to Trauma Room A.   Chaplain provided meaningful presence, hospitality, along with emotional and grief support to Tyreik who was overwhelmed by the shocking significant events of his father's medical condition that he witnessed. Chaplain remained with Tyreik during updates from medical staff and then escorted him to [place] while father was being transported to [place]. Tyreik shared that his sister had birth of child this past week and that his father was able to visit his daughter and meet new grandchild. Chaplain then escorted Tyreik back to E.D. in order to secure his father's personal belongings and then out to his vehicle parked in the E.D. Chaplain offered Tyreik space in order to process his thoughts and emotions.   Family contact information was updated into Epic and provided to [facility] staff so they could reach out to [facility] with updates of his father's condition. Total time involved with son 2 hours 15 minutes.  [address] [place], M. Min., [phone].

## 2021-11-20 NOTE — H&P (Signed)
Cardiology Admission History and Physical:   Patient ID: SATISH HAMMERS MRN: [id number]; DOB: [birth year]   Admission date: 11/20/2021  PCP:  Pcp, No   [facility] Providers Cardiologist:  None        Chief Complaint:  Back tightness  Patient Profile:   Hayden Ramirez is a 56 y.o. male with pmh sx for CAD (had STEMI in 2017  100% stenosis of 1st Mrg lesion (DES successfully placed with a 0% residual stenosis) and 90% stenosis of the Mid RCA (DES placed as well with 0% residual stenosis) who is being seen 11/20/2021 for the evaluation of acute STEMI.  History of Present Illness:   Hayden Ramirez is a 56 y.o. male with pmh sx for CAD (had STEMI in 2017  100% stenosis of 1st Mrg lesion (DES successfully placed with a 0% residual stenosis) and 90% stenosis of the Mid RCA (DES placed as well with 0% residual stenosis) who is being seen 11/20/2021 for the evaluation of acute STEMI. He was doing fine till this evening when he suddenly started to have back tightness and felt cold. Pt arrived to front desk with son, initially walked into lobby then collapsed, falling to the floor at the registration desk. He was coded in the ED for approx 8-10 minutes. VF arrest- ROSC was achieved. He was intubated. Post arrest EKG showed STEMI hence cardiology was consulted. He hit his head as well during the fall- hence was taken to CT head before LHC. CT head WNL.    Past Medical History:  Diagnosis Date   CAD (coronary artery disease)    a. 01/2016: STEMI w/ 100% stenosis 1st Mrg (DES placed) and 90% stenosis mid-RCA (DES placed).    HLD (hyperlipidemia)    Tobacco use     Past Surgical History:  Procedure Laterality Date   CARDIAC CATHETERIZATION N/A 01/30/2016   Procedure: Left Heart Cath and Coronary Angiography;  Surgeon: Corky Crafts, MD;  Location: [facility] INVASIVE CV LAB;  Service: Cardiovascular;  Laterality: N/A;   CARDIAC CATHETERIZATION N/A 01/30/2016   Procedure: Coronary Stent  Intervention;  Surgeon: Corky Crafts, MD;  Location: [facility] INVASIVE CV LAB;  Service: Cardiovascular;  Laterality: N/A;     Medications Prior to Admission: Prior to Admission medications   Medication Sig Start Date End Date Taking? Authorizing Provider  aspirin 81 MG chewable tablet Chew 1 tablet (81 mg total) by mouth daily. 02/02/16   Strader, Lennart Pall, PA-C  atorvastatin (LIPITOR) 40 MG tablet Take 1 tablet (40 mg total) by mouth daily. 06/02/16 08/31/16  Corky Crafts, MD  clopidogrel (PLAVIX) 75 MG tablet Take 4 tablets (300 mg) on the first day then decrease to 1 tablet (75 mg) daily there after 06/02/16   Corky Crafts, MD  losartan (COZAAR) 25 MG tablet Take 1 tablet (25 mg total) by mouth daily. 02/02/16   Strader, Lennart Pall, PA-C  metoprolol tartrate (LOPRESSOR) 25 MG tablet Take 0.5 tablets (12.5 mg total) by mouth 2 (two) times daily. 02/02/16   Strader, Lennart Pall, PA-C  nicotine (NICODERM CQ - DOSED IN MG/24 HOURS) 21 mg/24hr patch Place 1 patch (21 mg total) onto the skin daily. 03/28/16   Corky Crafts, MD  nitroGLYCERIN (NITRODUR - DOSED IN MG/24 HR) 0.4 mg/hr patch Place 0.4 mg onto the skin daily. 3 DOSES MAX    [provider]  OVER THE COUNTER MEDICATION Take 2 capsules by mouth daily. Platinum XT - Testosterone Booster  [provider]     Allergies:   No Known Allergies  Social History:   Social History   Socioeconomic History   Marital status: Single    Spouse name: Not on file   Number of children: Not on file   Years of education: Not on file   Highest education level: Not on file  Occupational History   Not on file  Tobacco Use   Smoking status: Light Smoker    Packs/day: 1.00    Types: Cigarettes   Smokeless tobacco: Never   Tobacco comments:    Smokes Black and Milds  Substance and Sexual Activity   Alcohol use: Yes    Comment: rare   Drug use: Yes    Types: Marijuana   Sexual activity: Not on file  Other  Topics Concern   Not on file  Social History Narrative   Not on file   Social Determinants of Health   Financial Resource Strain: Not on file  Food Insecurity: Not on file  Transportation Needs: Not on file  Physical Activity: Not on file  Stress: Not on file  Social Connections: Not on file  Intimate Partner Violence: Not on file    Family History:   The patient's family history includes Heart attack in his paternal grandfather.    ROS:  Please see the history of present illness.  All other ROS reviewed and negative.     Physical Exam/Data:   Vitals:   11/20/21 0211 11/20/21 0220 11/20/21 0230 11/20/21 0245  BP:  (!) 121/96 114/88 (!) 143/103  Pulse: (!) 143   (!) 101  Resp: SpO2: (!) 79%   92%   No intake or output data in the 24 hours ending 11/20/21 0354    03/28/2016   10:13 AM 02/18/2016    3:08 PM 02/08/2016   11:12 AM  Last 3 Weights  Weight (lbs) 174 lb 9.6 oz 179 lb 3.7 oz 176 lb 6.4 oz  Weight (kg) 79.198 kg 81.3 kg 80.015 kg     There is no height or weight on file to calculate BMI.  General:  Intubated and sedated HEENT: normal Neck: no JVD Vascular: No carotid bruits; Distal pulses 2+ bilaterally   Cardiac:  normal S1, S2; RRR; Lungs: Mechanical breath sounds.  Abd: soft, nontender, no hepatomegaly  Ext: no edema Musculoskeletal:  No deformities, BUE and BLE strength normal and equal Skin: warm and dry  Neuro:  Sedated   EKG:  The ECG that was done  was personally reviewed and demonstrates STEMI  Relevant CV Studies:  Echo 2017:  Study Conclusions   - Left ventricle: The cavity size was normal. Systolic function was    normal. The estimated ejection fraction was in the range of 55%    to 60%. Mild hypokinesis of the inferolateral myocardium. Left    ventricular diastolic function parameters were normal.  - Mitral valve: Transvalvular velocity was within the normal range.    There was no evidence for stenosis. There was  trivial    regurgitation.  - Right ventricle: The cavity size was mildly dilated. Wall    thickness was normal. Systolic function was normal.  - Atrial septum: No defect or patent foramen ovale was identified    by color flow Doppler.  - Tricuspid valve: Structurally normal valve. There was trivial    regurgitation.  - Pulmonary arteries: Systolic pressure was within the normal    range. PA peak pressure: 17  mm Hg (S).  - Global longitudinal strain -20.1%.   LHC 2017:  1st Mrg lesion, 100 %stenosed-culprit. A STENT SYNERGY DES 3.5X16 drug eluting stent, post dilated to 3.8 mm was successfully placed. Post intervention, there is a 0% residual stenosis. Mid RCA lesion, 90 %stenosed. A STENT SYNERGY DES 3X20 drug eluting stent, post dilated to 3.3 mm was successfully placed. Post intervention, there is a 0% residual stenosis. LV end diastolic pressure is mildly elevated. There is no aortic valve stenosis.   Continue dual antiplatelet therapy for at least a year. He'll need aggressive secondary prevention including smoking cessation.    Continue IV tirofiban for 3 hours.  Check echocardiogram to evaluate left ventricular function. I anticipate he'll be in the hospital for 2 days. Long-term, he may want a follow-up in the [place] office.  Laboratory Data:  High Sensitivity Troponin:  No results for input(s): TROPONINIHS in the last 720 hours.    Chemistry Recent Labs  Lab 11/20/21 0231 11/20/21 0311 11/20/21 0334  NA 143 140 140  K 2.6* 3.4* 3.5  CL 107 104  --   GLUCOSE 276* 262*  --   BUN 17 17  --   CREATININE 1.30* 1.50*  --     No results for input(s): PROT, ALBUMIN, AST, ALT, ALKPHOS, BILITOT in the last 168 hours. Lipids  Recent Labs  Lab 11/20/21 0225  CHOL 114  TRIG 96  HDL 28*  LDLCALC 67  CHOLHDL 4.1   Hematology Recent Labs  Lab 11/20/21 0225 11/20/21 0231 11/20/21 0311 11/20/21 0334  WBC 7.6  --   --   --   RBC 4.07*  --   --   --   HGB 12.9*    < > 14.3 13.6  HCT 41.3   < > 42.0 40.0  MCV 101.5*  --   --   --   MCH 31.7  --   --   --   MCHC 31.2  --   --   --   RDW 14.2  --   --   --   PLT 141*  --   --   --    < > = values in this interval not displayed.   Thyroid No results for input(s): TSH, FREET4 in the last 168 hours. BNPNo results for input(s): BNP, PROBNP in the last 168 hours.  DDimer No results for input(s): DDIMER in the last 168 hours.   Radiology/Studies:  CT Head Wo Contrast  Result Date: 11/20/2021 CLINICAL DATA:  Head trauma, abnormal mental status EXAM: CT HEAD WITHOUT CONTRAST TECHNIQUE: Contiguous axial images were obtained from the base of the skull through the vertex without intravenous contrast. RADIATION DOSE REDUCTION: This exam was performed according to the departmental dose-optimization program which includes automated exposure control, adjustment of the mA and/or kV according to patient size and/or use of iterative reconstruction technique. COMPARISON:  None Available. FINDINGS: Brain: No evidence of acute infarction, hemorrhage, cerebral edema, mass, mass effect, or midline shift. No hydrocephalus or extra-axial fluid collection. Hypodensity in the right inferior frontal lobe is consistent with the sequela of a remote infarct. Vascular: No hyperdense vessel. Skull: Normal. Negative for fracture or focal lesion. Sinuses/Orbits: Mucous retention cysts in the right maxillary sinus and frontal sinus. Mucosal thickening in the ethmoid air cells and right sphenoid sinus. Other: The mastoid air cells are well aerated. Fluid in the nasal cavity is likely related to the patient being intubated. IMPRESSION: No acute intracranial process. Electronically Signed  By: Wiliam KeAlison  Vasan M.D.   On: 11/20/2021 02:47   CARDIAC CATHETERIZATION  Result Date: 11/20/2021   Mid RCA lesion is 15% stenosed.   1st Mrg lesion is 100% stenosed.   A stent was successfully placed.   Post intervention, there is a 0% residual stenosis.   LV  end diastolic pressure is severely elevated. 1.  Very late stent thrombosis of prior obtuse marginal stent treated with 1 drug-eluting stent. 2.  Patent mid right coronary artery stent with mild in-stent restenosis with mild diffuse disease elsewhere. 3.  LVEDP of 30 mmHg; the patient received 40 mg of Lasix IV x1 in the cardiac catheterization laboratory 4.  Delay in care due to need to obtain CT scan to rule out intracranial hemorrhage prior to heparinization. Recommendation: Transferred to ICU for further evaluation and treatment; continue cangrelor for now and with planned load of DAPT later this admission.   DG Chest Portable 1 View  Result Date: 11/20/2021 CLINICAL DATA:  ET tube placement EXAM: PORTABLE CHEST 1 VIEW COMPARISON:  01/30/2016 FINDINGS: Endotracheal tube is 6 cm above the carina. NG tube enters the stomach. Heart is normal size. No confluent opacities or effusions. IMPRESSION: Endotracheal tube 6 cm above the carina. No acute cardiopulmonary disease. Electronically Signed   By: Charlett NoseKevin  Dover M.D.   On: 11/20/2021 02:33   DG Abd Portable 1 View  Result Date: 11/20/2021 CLINICAL DATA:  Cardiac arrest, NG tube. EXAM: PORTABLE ABDOMEN - 1 VIEW COMPARISON:  None Available. FINDINGS: The bowel gas pattern is normal. An enteric tube terminates in the stomach. No radio-opaque calculi or other significant radiographic abnormality are seen. IMPRESSION: Enteric tube terminates in the stomach. Electronically Signed   By: Thornell SartoriusLaura  Taylor M.D.   On: 11/20/2021 02:35     Assessment and Plan:   # Acute STEMI # Hypokalemia # CAD s/p DES in 2017  -Presents with VF cardiac arrest. EKG shows ST elevations -Started Amiodarone.  -Started Heparin -Cath lab activated- showed very late stent thrombosis of prior obtuse marginal stent treated with 1 drug-eluting stent. -Aspirin and Cangrelor -Echo  -Statin -Beta blocker -Potassium repletion  # Cardiac Arrest s/p intubation -PCCM  consulted -Ventilator management per PCCM -CXR  # Tobacco Abuse GN:[id number]:Counseling # HLD: C/w statins # HF with elevated LVEDP 30: Start Lasix IV 40. Repeat Echo. Initiate BB and ARB as tolerated    For questions or updates, please contact [facility] Please consult [email or web address] for contact info under     Signed, Hermelinda DellenMuhammad S Canaan Holzer, MD  11/20/2021 3:54 AM

## 2021-11-20 NOTE — ED Notes (Signed)
Pt arrived to front desk with son, initially walked into lobby then collapsed, falling to the floor at the registration desk. NT called for additional help; when RN arrived to the waiting room, staff was placing pt on stretcher. Pt noted to be seizing, mouth clenched, unresponsive

## 2021-11-20 NOTE — Progress Notes (Signed)
EEG complete - results pending 

## 2021-11-20 NOTE — Progress Notes (Signed)
Progress Note  Patient Name: Hayden Ramirez Date of Encounter: 11/21/2021  [facility] Cardiologist: None   Subjective   Extubated yesterday. Awake and alert this morning. States he has chest pain with coughing and deep inspiration.    Remains on IV amiodarone. Cr 1.43>1.19  Inpatient Medications    Scheduled Meds:  acetaminophen  650 mg Oral Q4H   Or   acetaminophen (TYLENOL) oral liquid 160 mg/5 mL  650 mg Per Tube Q4H   Or   acetaminophen  650 mg Rectal Q4H   aspirin  81 mg Per Tube Daily   atorvastatin  80 mg Oral Daily   Chlorhexidine Gluconate Cloth  6 each Topical Daily   docusate  100 mg Per Tube BID   mouth rinse  15 mL Mouth Rinse BID   pantoprazole sodium  40 mg Per Tube Daily   [START ON 11/22/2021] pneumococcal 20-valent conjugate vaccine  0.5 mL Intramuscular Tomorrow-1000   polyethylene glycol  17 g Per Tube Daily   sodium chloride flush  3 mL Intravenous Q12H   ticagrelor  90 mg Oral BID   Continuous Infusions:  sodium chloride     sodium chloride     amiodarone 30 mg/hr (11/21/21 0700)   PRN Meds: sodium chloride, acetaminophen **OR** acetaminophen (TYLENOL) oral liquid 160 mg/5 mL **OR** acetaminophen, ondansetron (ZOFRAN) IV, sodium chloride flush   Vital Signs    Vitals:   11/21/21 0745 11/21/21 0800 11/21/21 0815 11/21/21 0830  BP: 111/67 115/76 121/76 136/85  Pulse: 71 73 69 77  Resp: 18 (!) 21 (!) 23 (!) 22  Temp: 98.8 F (37.1 C) 98.6 F (37 C) 98.6 F (37 C) 98.8 F (37.1 C)  TempSrc:      SpO2: 97% 97% 97% 96%  Weight:      Height:        Intake/Output Summary (Last 24 hours) at 11/21/2021 0857 Last data filed at 11/21/2021 0800 Gross per 24 hour  Intake 1023.93 ml  Output 1150 ml  Net -126.07 ml      11/21/2021    4:00 AM 11/20/2021    6:00 AM 03/28/2016   10:13 AM  Last 3 Weights  Weight (lbs) 170 lb 13.7 oz 170 lb 3.1 oz 174 lb 9.6 oz  Weight (kg) 77.5 kg 77.2 kg 79.198 kg      Telemetry    NSR -  Personally Reviewed  ECG    No new tracing today - Personally Reviewed  Physical Exam   GEN: Awake, alert, comfortable Neck: No JVD Cardiac: RRR, no murmurs Respiratory: Rhonchorous GI: Soft, nontender, non-distended  MS: No edema; No deformity. Right radial access site c/d/i Neuro:  Nonfocal  Psych: Normal affect   Labs    High Sensitivity Troponin:   Recent Labs  Lab 11/20/21 0225 11/20/21 0552  TROPONINIHS 29* 13,662*     Chemistry Recent Labs  Lab 11/20/21 0225 11/20/21 0231 11/20/21 0311 11/20/21 0334 11/20/21 0825 11/20/21 1004 11/21/21 0200  NA 140   < > 140   < > 141 144 138  K 2.7*   < > 3.4*   < > 3.9 4.0 3.9  CL 108   < > 104  --   --  108 106  CO2 <7*  --   --   --   --  27 24  GLUCOSE 287*   < > 262*  --   --  120* 99  BUN 16   < > 17  --   --  18 18  CREATININE 1.62*   < > 1.50*  --   --  1.43* 1.19  CALCIUM 7.3*  --   --   --   --  8.3* 8.4*  MG 2.1  --   --   --   --   --  1.5*  PROT 4.3*  --   --   --   --   --   --   ALBUMIN 2.5*  --   --   --   --   --   --   AST 170*  --   --   --   --   --   --   ALT 134*  --   --   --   --   --   --   ALKPHOS 42  --   --   --   --   --   --   BILITOT 0.2*  --   --   --   --   --   --   GFRNONAA 50*  --   --   --   --  59* >60  ANIONGAP NOT CALCULATED  --   --   --   --  9 8   < > = values in this interval not displayed.    Lipids  Recent Labs  Lab 11/20/21 0225  CHOL 114  TRIG 96  HDL 28*  LDLCALC 67  CHOLHDL 4.1    Hematology Recent Labs  Lab 11/20/21 0225 11/20/21 0231 11/20/21 0825 11/20/21 1004 11/21/21 0342  WBC 7.6  --   --  6.2 11.8*  RBC 4.07*  --   --  5.20 4.47  HGB 12.9*   < > 16.7 16.3 14.0  HCT 41.3   < > 49.0 46.7 39.7  MCV 101.5*  --   --  89.8 88.8  MCH 31.7  --   --  31.3 31.3  MCHC 31.2  --   --  34.9 35.3  RDW 14.2  --   --  14.0 14.0  PLT 141*  --   --  255 225   < > = values in this interval not displayed.   Thyroid No results for input(s): TSH, FREET4 in  the last 168 hours.  BNPNo results for input(s): BNP, PROBNP in the last 168 hours.  DDimer No results for input(s): DDIMER in the last 168 hours.   Radiology    CT Head Wo Contrast  Result Date: 11/20/2021 CLINICAL DATA:  Head trauma, abnormal mental status EXAM: CT HEAD WITHOUT CONTRAST TECHNIQUE: Contiguous axial images were obtained from the base of the skull through the vertex without intravenous contrast. RADIATION DOSE REDUCTION: This exam was performed according to the departmental dose-optimization program which includes automated exposure control, adjustment of the mA and/or kV according to patient size and/or use of iterative reconstruction technique. COMPARISON:  None Available. FINDINGS: Brain: No evidence of acute infarction, hemorrhage, cerebral edema, mass, mass effect, or midline shift. No hydrocephalus or extra-axial fluid collection. Hypodensity in the right inferior frontal lobe is consistent with the sequela of a remote infarct. Vascular: No hyperdense vessel. Skull: Normal. Negative for fracture or focal lesion. Sinuses/Orbits: Mucous retention cysts in the right maxillary sinus and frontal sinus. Mucosal thickening in the ethmoid air cells and right sphenoid sinus. Other: The mastoid air cells are well aerated. Fluid in the nasal cavity is likely related to the patient being intubated. IMPRESSION: No acute intracranial process. Electronically  Signed   By: Merilyn Baba M.D.   On: 11/20/2021 02:47   CARDIAC CATHETERIZATION  Result Date: 11/20/2021   Mid RCA lesion is 15% stenosed.   1st Mrg lesion is 100% stenosed.   A stent was successfully placed.   Post intervention, there is a 0% residual stenosis.   LV end diastolic pressure is severely elevated. 1.  Very late stent thrombosis of prior obtuse marginal stent treated with 1 drug-eluting stent. 2.  Patent mid right coronary artery stent with mild in-stent restenosis with mild diffuse disease elsewhere. 3.  LVEDP of 30 mmHg; the  patient received 40 mg of Lasix IV x1 in the cardiac catheterization laboratory 4.  Delay in care due to need to obtain CT scan to rule out intracranial hemorrhage prior to heparinization. Recommendation: Transferred to ICU for further evaluation and treatment; continue cangrelor for now and with planned load of DAPT later this admission.   DG Chest Port 1 View  Result Date: 11/20/2021 CLINICAL DATA:  Acute respiratory failure EXAM: PORTABLE CHEST 1 VIEW COMPARISON:  Earlier today FINDINGS: Endotracheal tube with tip between the clavicular heads and carina. The enteric tube reaches the stomach. Artifact from EKG leads and defibrillator pads. Infiltrate at the bilateral lung bases. Nodule at the right lung base which has been present since at least 2014. IMPRESSION: 1. Bilateral lower lobe pneumonia. 2. Unremarkable hardware positioning. 3. Right lower lobe nodule seen since at least 2014. Electronically Signed   By: Jorje Guild M.D.   On: 11/20/2021 07:01   DG Chest Portable 1 View  Result Date: 11/20/2021 CLINICAL DATA:  ET tube placement EXAM: PORTABLE CHEST 1 VIEW COMPARISON:  01/30/2016 FINDINGS: Endotracheal tube is 6 cm above the carina. NG tube enters the stomach. Heart is normal size. No confluent opacities or effusions. IMPRESSION: Endotracheal tube 6 cm above the carina. No acute cardiopulmonary disease. Electronically Signed   By: Rolm Baptise M.D.   On: 11/20/2021 02:33   DG Abd Portable 1 View  Result Date: 11/20/2021 CLINICAL DATA:  Cardiac arrest, NG tube. EXAM: PORTABLE ABDOMEN - 1 VIEW COMPARISON:  None Available. FINDINGS: The bowel gas pattern is normal. An enteric tube terminates in the stomach. No radio-opaque calculi or other significant radiographic abnormality are seen. IMPRESSION: Enteric tube terminates in the stomach. Electronically Signed   By: Brett Fairy M.D.   On: 11/20/2021 02:35   EEG adult  Result Date: 11/20/2021 Derek Jack, MD     11/20/2021  8:32 PM  Routine EEG Report Courage CAMREN GEROW is a 56 y.o. male with a history of cardiac arrest who is undergoing an EEG to evaluate for seizures. Report: This EEG was acquired with electrodes placed according to the International 10-20 electrode system (including Fp1, Fp2, F3, F4, C3, C4, P3, P4, O1, O2, T3, T4, T5, T6, A1, A2, Fz, Cz, Pz). The following electrodes were missing or displaced: none. The best background was continuous activity at 7 Hz. This activity is reactive to stimulation. Sleep architecture was identified by K complexes and sleep spindles. There was no focal slowing. There were no interictal epileptiform discharges. There were no electrographic seizures identified. Photic stimulation and hyperventilation were not performed. Impression and clinical correlation: This EEG was obtained while sedated on fentanyl and is abnormal due to mild diffuse slowing indicative of global cerebral dysfunction. Epileptiform abnormalities were not seen during this recording. Su Monks, MD [facility] [phone] If 7pm- 7am, please page neurology on call as listed in [place].  ECHOCARDIOGRAM COMPLETE  Result Date: 11/20/2021    ECHOCARDIOGRAM REPORT   Patient Name:   VINCIL EELLS Date of Exam: 11/20/2021 Medical Rec #:  [id number]            Height:       71.0 in Accession #:    [id number]           Weight:       170.2 lb Date of Birth:  03-20-1966             BSA:          1.969 m Patient Age:    56 years             BP:           167/140 mmHg Patient Gender: M                    HR:           115 bpm. Exam Location:  Inpatient Procedure: 2D Echo, Cardiac Doppler and Color Doppler Indications:    Cardiac arrest  History:        Patient has prior history of Echocardiogram examinations, most                 recent 05/17/2016. CAD; Risk Factors:Dyslipidemia.  Sonographer:    Luisa Hart RDCS Referring Phys: [id number] Estill Cotta  Sonographer Comments: Suboptimal apical window, suboptimal  subcostal window, no subcostal window and Technically difficult study due to poor echo windows. IMPRESSIONS  1. Very limited echo given cardiac arrest.  2. Grossly normal left ventricular ejection fraction, by estimation, is 60 to 65%. The left ventricle has normal function. Left ventricular endocardial border not optimally defined to evaluate regional wall motion. Left ventricular diastolic function could not be evaluated.  3. Right ventricular systolic function was not well visualized. The right ventricular size is not well visualized.  4. The mitral valve is grossly normal. No evidence of mitral valve regurgitation.  5. The aortic valve was not assessed. Aortic valve regurgitation is not visualized. Comparison(s): No prior Echocardiogram. Conclusion(s)/Recommendation(s): Consider repeat limited echo when the patient is more stable. FINDINGS  Left Ventricle: Left ventricular ejection fraction, by estimation, is 60 to 65%. The left ventricle has normal function. Left ventricular endocardial border not optimally defined to evaluate regional wall motion. The left ventricular internal cavity size was normal in size. Suboptimal image quality limits for assessment of left ventricular hypertrophy. Left ventricular diastolic function could not be evaluated. Right Ventricle: The right ventricular size is not well visualized. Right vetricular wall thickness was not assessed. Right ventricular systolic function was not well visualized. Left Atrium: Left atrial size was not assessed. Right Atrium: Right atrial size was not assessed. Pericardium: There is no evidence of pericardial effusion. Mitral Valve: The mitral valve is grossly normal. No evidence of mitral valve regurgitation. Tricuspid Valve: The tricuspid valve is not assessed. Tricuspid valve regurgitation is not demonstrated. Aortic Valve: The aortic valve was not assessed. Aortic valve regurgitation is not visualized. Pulmonic Valve: The pulmonic valve was not  assessed. Pulmonic valve regurgitation is not visualized. Aorta: Aortic root could not be assessed. Venous: The inferior vena cava was not well visualized. IAS/Shunts: The interatrial septum was not assessed.   Diastology LV e' medial:  6.43 cm/s LV e' lateral: 15.60 cm/s  Lyman Bishop MD Electronically signed by Lyman Bishop MD Signature Date/Time: 11/20/2021/2:47:01 PM    Final     Cardiac Studies  LHC 11/20/21:   Mid RCA lesion is 15% stenosed.   1st Mrg lesion is 100% stenosed.   A stent was successfully placed.   Post intervention, there is a 0% residual stenosis.   LV end diastolic pressure is severely elevated.   1.  Very late stent thrombosis of prior obtuse marginal stent treated with 1 drug-eluting stent. 2.  Patent mid right coronary artery stent with mild in-stent restenosis with mild diffuse disease elsewhere. 3.  LVEDP of 30 mmHg; the patient received 40 mg of Lasix IV x1 in the cardiac catheterization laboratory 4.  Delay in care due to need to obtain CT scan to rule out intracranial hemorrhage prior to heparinization.   Recommendation: Transferred to ICU for further evaluation and treatment; continue cangrelor for now and with planned load of DAPT later this admission.    Patient Profile     56 y.o. male with history of CAD (had STEMI in 2017  100% stenosis of 1st Mrg lesion (DES successfully placed with a 0% residual stenosis) and 90% stenosis of the Mid RCA (DES placed as well with 0% residual stenosis), HTN, HLD, and tobacco abuse who presented to the ER with acute STEMI with course complicated by VF arrest on arrival s/p CPR with ROSC and cath with very late stent thrombosis of OM1 s/p DES. Now improved and extubated.   Assessment & Plan    #STEMI: #Known CAD with history of prior STEMI: #VF Arrest: Patient with known history of CAD with prior STEMI in 2017 s/p DES to OM1 (culprit lesion) and RCA who presented with acute inferolateral STEMI with course complicated by  VF arrest on arrival. Received 25min CPR with ROSC. Taken to cath lab which showed late stent thrombosis of OM1 s/p DES with excellent angiographic results. Now extubated. TTE essentially uninterruptable due to poor windows, very few images, and lack of visualization of LV endocardium.   -Continue ASA 81mg  daily, brilinta 90mg  BID -Continue lipitor 80mg  daily -Start metop 25mg  XL daily -Will add ARB as able -Repeat TTE with definity as prior study essentially uninterruptable  -Stop amiodarone  #HLD: -Continue lipitor 80mg  daily -Continue zetia 10mg  daily -Goal LDL<55 -Needs repeat lipids in 6-8weeks  #HTN: -Resume metop as above -Add ARB prior to discharge as able  #Tobacco Abuse: Encourage cessation      For questions or updates, please contact [facility] Please consult [email or web address] for contact info under        Signed, Freada Bergeron, MD  11/21/2021, 8:57 AM

## 2021-11-20 NOTE — Progress Notes (Signed)
eLink Physician-Brief Progress Note Patient Name: Hayden Ramirez DOB: 06-06-1966 MRN: [id number]   Date of Service  11/20/2021  HPI/Events of Note  Patient needs restraints to prevent self extubation.  eICU Interventions  Bilateral soft wrist restraints ordered.        Thomasene Lot [name] 11/20/2021, 6:13 AM

## 2021-11-20 NOTE — Progress Notes (Signed)
eLink Physician-Brief Progress Note Patient Name: Hayden Ramirez DOB: 1966/02/15 MRN: [id number]   Date of Service  11/20/2021  HPI/Events of Note  ABG reviewed.  eICU Interventions   PEEP increased to 12, stat portable CXR now, ABG in one hour        Jaren Vanetten U Serai Tukes 11/20/2021, 5:35 AM

## 2021-11-20 NOTE — Progress Notes (Signed)
Patient was transported to CT and to CATH lab via the ventilator with no complications.

## 2021-11-20 NOTE — Progress Notes (Signed)
Pt waking up, making eye contact, moving purposefully toward ETT, attempting to pull it out. Bilateral soft wrist restraints applied.

## 2021-11-20 NOTE — Progress Notes (Addendum)
Progress Note  Patient Name: Hayden Ramirez Date of Encounter: 11/20/2021  [facility] Cardiologist: None   Subjective   Remains intubated but responding to questions and commands. Family at bedside.   Inpatient Medications    Scheduled Meds:  acetaminophen  650 mg Oral Q4H   Or   acetaminophen (TYLENOL) oral liquid 160 mg/5 mL  650 mg Per Tube Q4H   Or   acetaminophen  650 mg Rectal Q4H   aspirin  81 mg Per Tube Daily   atorvastatin  80 mg Oral Daily   docusate  100 mg Per Tube BID   pantoprazole sodium  40 mg Per Tube Daily   polyethylene glycol  17 g Per Tube Daily   sodium chloride flush  3 mL Intravenous Q12H   ticagrelor  90 mg Oral BID   Continuous Infusions:  sodium chloride     sodium chloride     amiodarone 30 mg/hr (11/20/21 0829)   cangrelor 50 mg in NS 250 mL 4 mcg/kg/min (11/20/21 1042)   PRN Meds: sodium chloride, [START ON 11/21/2021] acetaminophen **OR** [START ON 11/21/2021] acetaminophen (TYLENOL) oral liquid 160 mg/5 mL **OR** [START ON 11/21/2021] acetaminophen, busPIRone **OR** busPIRone, fentaNYL (SUBLIMAZE) injection, hydrALAZINE, labetalol, midazolam, midazolam, ondansetron (ZOFRAN) IV, sodium chloride flush   Vital Signs    Vitals:   11/20/21 0800 11/20/21 0828 11/20/21 0850 11/20/21 1000  BP:  (!) 124/98  104/83  Pulse:  89  82  Resp:  (!) 22  (!) 30  Temp: (!) 97.3 F (36.3 C) 98.2 F (36.8 C) 98.8 F (37.1 C) (!) 101.1 F (38.4 C)  TempSrc:      SpO2:  100%  96%  Weight:        Intake/Output Summary (Last 24 hours) at 11/20/2021 1059 Last data filed at 11/20/2021 1000 Gross per 24 hour  Intake 753.02 ml  Output 2050 ml  Net -1296.98 ml      11/20/2021    6:00 AM 03/28/2016   10:13 AM 02/18/2016    3:08 PM  Last 3 Weights  Weight (lbs) 170 lb 3.1 oz 174 lb 9.6 oz 179 lb 3.7 oz  Weight (kg) 77.2 kg 79.198 kg 81.3 kg      Telemetry    NSR - Personally Reviewed  ECG    NSR, inferior q-waves, subtle STE in  inferolateral leads - Personally Reviewed  Physical Exam   GEN: Intubated but responding to questions/commands  Neck: No JVD Cardiac: RRR, no murmurs Respiratory: Clear to auscultation bilaterally. GI: Soft, nontender, non-distended  MS: No edema; No deformity. Right radial access site c/d/i Neuro:  Nonfocal  Psych: Normal affect   Labs    High Sensitivity Troponin:   Recent Labs  Lab 11/20/21 0225 11/20/21 0552  TROPONINIHS 29* 13,662*     Chemistry Recent Labs  Lab 11/20/21 0225 11/20/21 0231 11/20/21 0311 11/20/21 0334 11/20/21 0512 11/20/21 0825 11/20/21 1004  NA 140 143 140   < > 140 141 144  K 2.7* 2.6* 3.4*   < > 3.2* 3.9 4.0  CL 108 107 104  --   --   --  108  CO2 <7*  --   --   --   --   --  27  GLUCOSE 287* 276* 262*  --   --   --  120*  BUN 16 17 17   --   --   --  18  CREATININE 1.62* 1.30* 1.50*  --   --   --  1.43*  CALCIUM 7.3*  --   --   --   --   --  8.3*  MG 2.1  --   --   --   --   --   --   PROT 4.3*  --   --   --   --   --   --   ALBUMIN 2.5*  --   --   --   --   --   --   AST 170*  --   --   --   --   --   --   ALT 134*  --   --   --   --   --   --   ALKPHOS 42  --   --   --   --   --   --   BILITOT 0.2*  --   --   --   --   --   --   GFRNONAA 50*  --   --   --   --   --  59*  ANIONGAP NOT CALCULATED  --   --   --   --   --  9   < > = values in this interval not displayed.    Lipids  Recent Labs  Lab 11/20/21 0225  CHOL 114  TRIG 96  HDL 28*  LDLCALC 67  CHOLHDL 4.1    Hematology Recent Labs  Lab 11/20/21 0225 11/20/21 0231 11/20/21 0512 11/20/21 0825 11/20/21 1004  WBC 7.6  --   --   --  6.2  RBC 4.07*  --   --   --  5.20  HGB 12.9*   < > 17.7* 16.7 16.3  HCT 41.3   < > 52.0 49.0 46.7  MCV 101.5*  --   --   --  89.8  MCH 31.7  --   --   --  31.3  MCHC 31.2  --   --   --  34.9  RDW 14.2  --   --   --  14.0  PLT 141*  --   --   --  255   < > = values in this interval not displayed.   Thyroid No results for input(s):  TSH, FREET4 in the last 168 hours.  BNPNo results for input(s): BNP, PROBNP in the last 168 hours.  DDimer No results for input(s): DDIMER in the last 168 hours.   Radiology    CT Head Wo Contrast  Result Date: 11/20/2021 CLINICAL DATA:  Head trauma, abnormal mental status EXAM: CT HEAD WITHOUT CONTRAST TECHNIQUE: Contiguous axial images were obtained from the base of the skull through the vertex without intravenous contrast. RADIATION DOSE REDUCTION: This exam was performed according to the departmental dose-optimization program which includes automated exposure control, adjustment of the mA and/or kV according to patient size and/or use of iterative reconstruction technique. COMPARISON:  None Available. FINDINGS: Brain: No evidence of acute infarction, hemorrhage, cerebral edema, mass, mass effect, or midline shift. No hydrocephalus or extra-axial fluid collection. Hypodensity in the right inferior frontal lobe is consistent with the sequela of a remote infarct. Vascular: No hyperdense vessel. Skull: Normal. Negative for fracture or focal lesion. Sinuses/Orbits: Mucous retention cysts in the right maxillary sinus and frontal sinus. Mucosal thickening in the ethmoid air cells and right sphenoid sinus. Other: The mastoid air cells are well aerated. Fluid in the nasal cavity is likely related to the patient being intubated. IMPRESSION:  No acute intracranial process. Electronically Signed   By: Wiliam Ke M.D.   On: 11/20/2021 02:47   CARDIAC CATHETERIZATION  Result Date: 11/20/2021   Mid RCA lesion is 15% stenosed.   1st Mrg lesion is 100% stenosed.   A stent was successfully placed.   Post intervention, there is a 0% residual stenosis.   LV end diastolic pressure is severely elevated. 1.  Very late stent thrombosis of prior obtuse marginal stent treated with 1 drug-eluting stent. 2.  Patent mid right coronary artery stent with mild in-stent restenosis with mild diffuse disease elsewhere. 3.  LVEDP of  30 mmHg; the patient received 40 mg of Lasix IV x1 in the cardiac catheterization laboratory 4.  Delay in care due to need to obtain CT scan to rule out intracranial hemorrhage prior to heparinization. Recommendation: Transferred to ICU for further evaluation and treatment; continue cangrelor for now and with planned load of DAPT later this admission.   DG Chest Port 1 View  Result Date: 11/20/2021 CLINICAL DATA:  Acute respiratory failure EXAM: PORTABLE CHEST 1 VIEW COMPARISON:  Earlier today FINDINGS: Endotracheal tube with tip between the clavicular heads and carina. The enteric tube reaches the stomach. Artifact from EKG leads and defibrillator pads. Infiltrate at the bilateral lung bases. Nodule at the right lung base which has been present since at least 2014. IMPRESSION: 1. Bilateral lower lobe pneumonia. 2. Unremarkable hardware positioning. 3. Right lower lobe nodule seen since at least 2014. Electronically Signed   By: Tiburcio Pea M.D.   On: 11/20/2021 07:01   DG Chest Portable 1 View  Result Date: 11/20/2021 CLINICAL DATA:  ET tube placement EXAM: PORTABLE CHEST 1 VIEW COMPARISON:  01/30/2016 FINDINGS: Endotracheal tube is 6 cm above the carina. NG tube enters the stomach. Heart is normal size. No confluent opacities or effusions. IMPRESSION: Endotracheal tube 6 cm above the carina. No acute cardiopulmonary disease. Electronically Signed   By: Charlett Nose M.D.   On: 11/20/2021 02:33   DG Abd Portable 1 View  Result Date: 11/20/2021 CLINICAL DATA:  Cardiac arrest, NG tube. EXAM: PORTABLE ABDOMEN - 1 VIEW COMPARISON:  None Available. FINDINGS: The bowel gas pattern is normal. An enteric tube terminates in the stomach. No radio-opaque calculi or other significant radiographic abnormality are seen. IMPRESSION: Enteric tube terminates in the stomach. Electronically Signed   By: Thornell Sartorius M.D.   On: 11/20/2021 02:35    Cardiac Studies   LHC 11/20/21:   Mid RCA lesion is 15% stenosed.    1st Mrg lesion is 100% stenosed.   A stent was successfully placed.   Post intervention, there is a 0% residual stenosis.   LV end diastolic pressure is severely elevated.   1.  Very late stent thrombosis of prior obtuse marginal stent treated with 1 drug-eluting stent. 2.  Patent mid right coronary artery stent with mild in-stent restenosis with mild diffuse disease elsewhere. 3.  LVEDP of 30 mmHg; the patient received 40 mg of Lasix IV x1 in the cardiac catheterization laboratory 4.  Delay in care due to need to obtain CT scan to rule out intracranial hemorrhage prior to heparinization.   Recommendation: Transferred to ICU for further evaluation and treatment; continue cangrelor for now and with planned load of DAPT later this admission.    Patient Profile     56 y.o. male with history of CAD (had STEMI in 2017  100% stenosis of 1st Mrg lesion (DES successfully placed with a 0% residual  stenosis) and 90% stenosis of the Mid RCA (DES placed as well with 0% residual stenosis), HTN, HLD, and tobacco abuse who presented to the ER with acute STEMI with course complicated by VF arrest on arrival s/p CPR with ROSC and cath with very late stent thrombosis of OM1 s/p DES.   Assessment & Plan    #STEMI: #Known CAD with history of prior STEMI: #VF Arrest: Patient with known history of CAD with prior STEMI in 2017 s/p DES to OM1 (culprit lesion) and RCA who presented with acute inferolateral STEMI with course complicated by VF arrest on arrival. Received CPR with ROSC. Taken to cath lab which showed late stent thrombosis of OM1 s/p DES with excellent angiographic results. Currently intubated in the ICU but responding.  -Cangrelor to stop 4 hours after brilinta load -TTE pending -Continue ASA 81mg  daily, brilinta 90mg  BID -Continue lipitor 80mg  daily -Will add BB and ARB as able  #HLD: -Continue lipitor 80mg  daily -Start zetia 10mg  daily -Goal LDL<55  #HTN: -Will resume metop and ARB as  able  #Tobacco Abuse: Encourage cessation      For questions or updates, please contact [facility] Please consult [email or web address] for contact info under        Signed, , MD  11/20/2021, 10:59 AM

## 2021-11-20 NOTE — Progress Notes (Signed)
Seen and examined. Waking up and following commands Lungs sounds clear Cath access site looks CDI Work on weaning and can use precedex PRN Family at bedside updated and more coming in from out of town Will not need TTM  Additional 32 min cc time Erskine Emery MD PCCM

## 2021-11-20 NOTE — Procedures (Signed)
Extubation Procedure Note  Patient Details:   Name: Hayden Ramirez DOB: 1965-08-14 MRN: [id number]   Airway Documentation:    Vent end date: 11/20/21 Vent end time: 1325   Evaluation  O2 sats: stable throughout Complications: No apparent complications Patient did tolerate procedure well. Bilateral Breath Sounds: Diminished   Yes patient alert and oriented able to say his name and where he was.  Good strong productive cough.  Placed on 15 L HFNC due to increased RR.  Elbert Ewings [place] 11/20/2021, 1:38 PM

## 2021-11-20 NOTE — Progress Notes (Addendum)
Outgoing chaplain Albertina Parr made introductions to facilitate transfer of care following pt's significant health crisis overnight. Will continue to follow.    1315: Chaplain returned again in the afternoon to check on family.  Met sister and BIL.  Hayden Ramirez had gone home.  Pt was had just been successfully extubated.     Minus [place], [place] Pager:  [phone]   11/20/21 0900  Clinical Encounter Type  Visited With Family  Visit Type Initial;Spiritual support  Referral From Chaplain  Consult/Referral To Chaplain  Spiritual Encounters  Spiritual Needs Emotional  Stress Factors  Patient Stress Factors Major life changes  Family Stress Factors Loss of control

## 2021-11-20 NOTE — Consult Note (Signed)
NAME:  Hayden Ramirez, MRN:  [id number], DOB:  01/22/1966, LOS: 0 ADMISSION DATE:  11/20/2021, CONSULTATION DATE:  11/20/21 REFERRING MD:  Santina Evans, MD CHIEF COMPLAINT:  Vent management   History of Present Illness:  56 year old male with pmhx as noted below who presented with back tightness. In the ED lobby, he collapsed, hit his head and was coded for ~10 minutes for Vfib arrest. After ROSC he was intubated and EKG demonstrated STEMI. CT head was NAICA. Cardiology consulted and cath lab activated and showed late stent thrombosis of prior obtuse marginal stent. Treated with DES x 1. Started on BB, ASA, cangrelor, heparin and amiodarone. Lasix ordered. K repleted  Pertinent  Medical History  CAD, hx STEMI s/p DES to 1st marginal lesion and RCA  Significant Hospital Events: Including procedures, antibiotic start and stop dates in addition to other pertinent events     Interim History / Subjective:  As above  Objective   Blood pressure (!) 131/106, pulse 65, temperature (!) 93.6 F (34.2 C), temperature source Bladder, resp. rate (!) 30, SpO2 93 %.    Vent Mode: PRVC FiO2 (%):  [100 %] 100 % Set Rate:  [20 bmp-30 bmp] 30 bmp Vt Set:  [550 mL-600 mL] 600 mL PEEP:  [8 cmH20] 8 cmH20 Plateau Pressure:  [16 cmH20-23 cmH20] 23 cmH20  No intake or output data in the 24 hours ending 11/20/21 0422 There were no vitals filed for this visit.  Physical Exam: General: Critically ill-appearing, unresponsive HENT: [place], AT, ETT in place Eyes: EOMI, no scleral icterus, conjunctival erythema Respiratory: Coarse breath sounds bilaterally.  No crackles, wheezing  Cardiovascular: RRR, -M/R/G, no JVD GI: BS+, soft, nontender Extremities:-Edema,-tenderness Neuro: Unresponsive, no withdrawal GU: Foley in place  Resolved Hospital Problem list   N/A  Assessment & Plan:   VF arrest Acute STEMI  In-stent thrombosis s/p DES x 1 -Telemetry -Amiodarone gtt -ASA, Cangrelor, Heparin  gtt -Lasix 40 mg x 1 given -Normothermia protocol -K >4, Mg >2. 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 -CT head neg -PRN versed per PAD protocol -Spot EEG  Acute hypoxemic respiratory failure 2/2 cardiac arrest Tobacco history -Full vent support -LTVV, 4-8cc/kg IBW with goal Pplat<30 and DP<15 -ABG ordered -SBT/WUA daily when eligible -PAD protocol -VAP  Hypokalemia -Repleted -Recheck BMET at [place] (right click and "Reselect all SmartList Selections" daily)   Diet/type: NPO DVT prophylaxis: other per primary/Cardiology team GI prophylaxis: PPI Lines: N/A Foley:  Yes, and it is still needed Code Status:  full code Last date of multidisciplinary goals of care discussion [ per primary]  Labs   CBC: Recent Labs  Lab 11/20/21 0225 11/20/21 0231 11/20/21 0311 11/20/21 0334  WBC 7.6  --   --   --   NEUTROABS 2.7  --   --   --   HGB 12.9* 13.3 14.3 13.6  HCT 41.3 39.0 42.0 40.0  MCV 101.5*  --   --   --   PLT 141*  --   --   --     Basic Metabolic Panel: Recent Labs  Lab 11/20/21 0225 11/20/21 0231 11/20/21 0311 11/20/21 0334  NA 140 143 140 140  K 2.7* 2.6* 3.4* 3.5  CL 108 107 104  --   CO2 >45*  --   --   --   GLUCOSE 287* 276* 262*  --   BUN 16 17 17   --  CREATININE 1.62* 1.30* 1.50*  --   CALCIUM 7.3*  --   --   --   MG 2.1  --   --   --    GFR: CrCl cannot be calculated (Unknown ideal weight.). Recent Labs  Lab 11/20/21 0225  WBC 7.6    Liver Function Tests: Recent Labs  Lab 11/20/21 0225  AST 170*  ALT 134*  ALKPHOS 42  BILITOT 0.2*  PROT 4.3*  ALBUMIN 2.5*   No results for input(s): LIPASE, AMYLASE in the last 168 hours. No results for input(s): AMMONIA in the last 168 hours.  ABG    Component Value Date/Time   PHART 7.277 (L) 11/20/2021 0334   PCO2ART 56.2 (H) 11/20/2021 0334   PO2ART 94 11/20/2021 0334   HCO3 26.2 11/20/2021 0334   TCO2 28  11/20/2021 0334   ACIDBASEDEF 2.0 11/20/2021 0334   O2SAT 96 11/20/2021 0334     Coagulation Profile: Recent Labs  Lab 11/20/21 0225  INR 1.5*    Cardiac Enzymes: No results for input(s): CKTOTAL, CKMB, CKMBINDEX, TROPONINI in the last 168 hours.  HbA1C: Hgb A1c MFr Bld  Date/Time Value Ref Range Status  11/20/2021 02:25 AM 5.8 (H) 4.8 - 5.6 % Final    Comment:    (NOTE) Pre diabetes:          5.7%-6.4%  Diabetes:              >6.4%  Glycemic control for   <7.0% adults with diabetes   01/31/2016 05:41 AM 5.9 (H) 4.8 - 5.6 % Final    Comment:    (NOTE)         Pre-diabetes: 5.7 - 6.4         Diabetes: >6.4         Glycemic control for adults with diabetes: <7.0     CBG: No results for input(s): GLUCAP in the last 168 hours.  Review of Systems:   Unable to obtain due to critical illness  Past Medical History:  He,  has a past medical history of CAD (coronary artery disease), HLD (hyperlipidemia), and Tobacco use.   Surgical History:   Past Surgical History:  Procedure Laterality Date   CARDIAC CATHETERIZATION N/A 01/30/2016   Procedure: Left Heart Cath and Coronary Angiography;  Surgeon: Jettie Booze, MD;  Location: [place] [facility];  Service: Cardiovascular;  Laterality: N/A;   CARDIAC CATHETERIZATION N/A 01/30/2016   Procedure: Coronary Stent Intervention;  Surgeon: Jettie Booze, MD;  Location: [place] [facility];  Service: Cardiovascular;  Laterality: N/A;     Social History:   reports that he has been smoking cigarettes. He has been smoking an average of 1 pack per day. He has never used smokeless tobacco. He reports current alcohol use. He reports current drug use. Drug: Marijuana.   Family History:  His family history includes Heart attack in his paternal grandfather.   Allergies No Known Allergies   Home Medications  Prior to Admission medications   Medication Sig Start Date End Date Taking? Authorizing Provider  aspirin 81 MG  chewable tablet Chew 1 tablet (81 mg total) by mouth daily. 02/02/16   Strader, Fransisco Hertz, PA-C  atorvastatin (LIPITOR) 40 MG tablet Take 1 tablet (40 mg total) by mouth daily. 06/02/16 08/31/16  Jettie Booze, MD  clopidogrel (PLAVIX) 75 MG tablet Take 4 tablets (300 mg) on the first day then decrease to 1 tablet (75 mg) daily there after 06/02/16   [place],  Charlann Lange, MD  losartan (COZAAR) 25 MG tablet Take 1 tablet (25 mg total) by mouth daily. 02/02/16   Strader, Fransisco Hertz, PA-C  metoprolol tartrate (LOPRESSOR) 25 MG tablet Take 0.5 tablets (12.5 mg total) by mouth 2 (two) times daily. 02/02/16   Strader, Fransisco Hertz, PA-C  nicotine (NICODERM CQ - DOSED IN MG/24 HOURS) 21 mg/24hr patch Place 1 patch (21 mg total) onto the skin daily. 03/28/16   Jettie Booze, MD  nitroGLYCERIN (NITRODUR - DOSED IN MG/24 HR) 0.4 mg/hr patch Place 0.4 mg onto the skin daily. 3 DOSES MAX    [provider]  OVER THE COUNTER MEDICATION Take 2 capsules by mouth daily. Platinum XT - Testosterone Booster    [provider]     Critical care time: 45 min    The patient is critically ill with multiple organ systems failure and requires high complexity decision making for assessment and support, frequent evaluation and titration of therapies, application of advanced monitoring technologies and extensive interpretation of multiple databases.    Rodman Pickle, M.D. [facility] Pulmonary/Critical Care Medicine 11/20/2021 4:23 AM   Please see Amion for pager number to reach on-call Pulmonary and Critical Care Team.

## 2021-11-20 NOTE — ED Notes (Addendum)
Pt arrived to ED POV with son taken to traige at approximately 0145 after report of back pain starting at  . Pt went unresponsive in route to trauma A from triage. Upon arrival to Trauma A pt was found to be in cardiac arrest.   0150 CPR Started 0152 Pulse Check No Pulse 0153 Shocked 150 VF 0154 Pulse Check No Pulse 0155 Sodium Bicarb 0156 Atropine 0156 Pulse Check No Pulse 0157 Shocked 150 VF 0158 EPI 0159 Pulse Check ROSC CPR discontinued  0200 EKG 0203 20 Etomidate 0203 100 Rocuronium  0206 ETT 7.5 at 27 at the lip (Cardama) 0207 Amiodarone 150 bolus  0210 16 OG tube (Brodric Schauer) 0233 Transported to CT (Nichollas Perusse/RT) 0251 Arrival to Cath Lab

## 2021-11-20 NOTE — Progress Notes (Signed)
Patient was transported from [place] lab to 2H22 via the ventilator with no complications.

## 2021-11-21 ENCOUNTER — Inpatient Hospital Stay (HOSPITAL_COMMUNITY): Payer: BC Managed Care – PPO

## 2021-11-21 ENCOUNTER — Encounter (HOSPITAL_COMMUNITY): Payer: Self-pay | Admitting: Internal Medicine

## 2021-11-21 ENCOUNTER — Other Ambulatory Visit: Payer: Self-pay

## 2021-11-21 DIAGNOSIS — I2121 ST elevation (STEMI) myocardial infarction involving left circumflex coronary artery: Secondary | ICD-10-CM | POA: Diagnosis not present

## 2021-11-21 DIAGNOSIS — I469 Cardiac arrest, cause unspecified: Secondary | ICD-10-CM | POA: Diagnosis not present

## 2021-11-21 LAB — ECHOCARDIOGRAM LIMITED
Area-P 1/2: 4.39 cm2
Height: 71 in
S' Lateral: 3.7 cm
Weight: 2733.7 oz

## 2021-11-21 LAB — CBC
HCT: 39.7 % (ref 39.0–52.0)
Hemoglobin: 14 g/dL (ref 13.0–17.0)
MCH: 31.3 pg (ref 26.0–34.0)
MCHC: 35.3 g/dL (ref 30.0–36.0)
MCV: 88.8 fL (ref 80.0–100.0)
Platelets: 225 10*3/uL (ref 150–400)
RBC: 4.47 MIL/uL (ref 4.22–5.81)
RDW: 14 % (ref 11.5–15.5)
WBC: 11.8 10*3/uL — ABNORMAL HIGH (ref 4.0–10.5)
nRBC: 0 % (ref 0.0–0.2)

## 2021-11-21 LAB — BASIC METABOLIC PANEL
Anion gap: 8 (ref 5–15)
BUN: 18 mg/dL (ref 6–20)
CO2: 24 mmol/L (ref 22–32)
Calcium: 8.4 mg/dL — ABNORMAL LOW (ref 8.9–10.3)
Chloride: 106 mmol/L (ref 98–111)
Creatinine, Ser: 1.19 mg/dL (ref 0.61–1.24)
GFR, Estimated: >60 mL/min (ref >60)
Glucose, Bld: 99 mg/dL (ref 70–99)
Potassium: 3.9 mmol/L (ref 3.5–5.1)
Sodium: 138 mmol/L (ref 135–145)

## 2021-11-21 LAB — MAGNESIUM: Magnesium: 1.5 mg/dL — ABNORMAL LOW (ref 1.7–2.4)

## 2021-11-21 LAB — PHOSPHORUS: Phosphorus: 2.6 mg/dL (ref 2.5–4.6)

## 2021-11-21 MED ORDER — AMIODARONE HCL IN DEXTROSE 360-4.14 MG/200ML-% IV SOLN
60.0000 mg/h | INTRAVENOUS | Status: AC
Start: 2021-11-21 — End: 2021-11-21
  Administered 2021-11-21: 60 mg/h via INTRAVENOUS

## 2021-11-21 MED ORDER — POTASSIUM CHLORIDE CRYS ER 20 MEQ PO TBCR
40.0000 meq | EXTENDED_RELEASE_TABLET | Freq: Once | ORAL | Status: AC
  Administered 2021-11-21: 40 meq via ORAL
  Filled 2021-11-21: qty 2

## 2021-11-21 MED ORDER — PERFLUTREN LIPID MICROSPHERE
1.0000 mL | INTRAVENOUS | Status: AC | PRN
  Administered 2021-11-21: 2 mL via INTRAVENOUS

## 2021-11-21 MED ORDER — METOPROLOL SUCCINATE ER 25 MG PO TB24
25.0000 mg | ORAL_TABLET | Freq: Every day | ORAL | Status: DC
  Administered 2021-11-21: 25 mg via ORAL
  Filled 2021-11-21: qty 1

## 2021-11-21 MED ORDER — PNEUMOCOCCAL 20-VAL CONJ VACC 0.5 ML IM SUSY
0.5000 mL | PREFILLED_SYRINGE | INTRAMUSCULAR | Status: DC
  Filled 2021-11-21: qty 0.5

## 2021-11-21 MED ORDER — AMIODARONE HCL IN DEXTROSE 360-4.14 MG/200ML-% IV SOLN
INTRAVENOUS | Status: AC
  Administered 2021-11-21: 150 mg via INTRAVENOUS
  Filled 2021-11-21: qty 200

## 2021-11-21 MED ORDER — AMIODARONE LOAD VIA INFUSION: 150.0000 mg | Freq: Once | INTRAVENOUS | Status: AC

## 2021-11-21 MED ORDER — AMIODARONE HCL IN DEXTROSE 360-4.14 MG/200ML-% IV SOLN
30.0000 mg/h | INTRAVENOUS | Status: DC
  Administered 2021-11-21: 30 mg/h via INTRAVENOUS
  Filled 2021-11-21: qty 200

## 2021-11-21 MED ORDER — MAGNESIUM SULFATE 4 GM/100ML IV SOLN
4.0000 g | Freq: Once | INTRAVENOUS | Status: AC
  Administered 2021-11-21: 4 g via INTRAVENOUS
  Filled 2021-11-21: qty 100

## 2021-11-21 MED ORDER — ASPIRIN 81 MG PO TBEC
81.0000 mg | DELAYED_RELEASE_TABLET | Freq: Every day | ORAL | Status: DC
  Administered 2021-11-21 – 2021-11-23 (×3): 81 mg via ORAL
  Filled 2021-11-21 (×3): qty 1

## 2021-11-21 MED ORDER — ORAL CARE MOUTH RINSE
15.0000 mL | Freq: Two times a day (BID) | OROMUCOSAL | Status: DC
  Administered 2021-11-21 – 2021-11-23 (×4): 15 mL via OROMUCOSAL

## 2021-11-21 MED ORDER — PANTOPRAZOLE SODIUM 40 MG PO TBEC
40.0000 mg | DELAYED_RELEASE_TABLET | Freq: Every day | ORAL | Status: DC
  Administered 2021-11-21 – 2021-11-23 (×3): 40 mg via ORAL
  Filled 2021-11-21 (×3): qty 1

## 2021-11-21 MED ORDER — AMIODARONE HCL IN DEXTROSE 360-4.14 MG/200ML-% IV SOLN
INTRAVENOUS | Status: AC
  Filled 2021-11-21: qty 200

## 2021-11-21 MED ORDER — DOCUSATE SODIUM 100 MG PO CAPS
100.0000 mg | ORAL_CAPSULE | Freq: Two times a day (BID) | ORAL | Status: DC
  Administered 2021-11-21 – 2021-11-22 (×2): 100 mg via ORAL
  Filled 2021-11-21 (×3): qty 1

## 2021-11-21 NOTE — Evaluation (Signed)
Physical Therapy Evaluation Patient Details Name: Hayden Ramirez MRN: [id number] DOB: 09/16/1965 Today's Date: 11/21/2021  History of Present Illness  The pt is a 56 yo male presenting 5/27 became unresponsive shortly after arrival to ED triage. Pt underwent 8 min CPR and was shocked x2 prior to ROSC. Pt intubated after ROSC, EKG showing STEMI. Extubated 5/27. PMH includes: CAD, STEMI s/p DES to 1st marginal lesion and RCA, HTN, HLD, and tobacco use.   Clinical Impression  Pt in bed upon arrival of PT, agreeable to evaluation at this time. Prior to admission the pt was completely independent with all mobility, working active job full time. The pt now presents with limitations in functional mobility, activity tolerance, dynamic stability, and endurance due to above dx, and will continue to benefit from skilled PT to address these deficits. The pt was able to demo good capacity for bed mobility without assist, but did require minA of 1-2 for sit-stand transfers and step-pivot transfer to wheelchair during session. With mobility, pt developed recurrent episodes of VT, RN present and MD notified, canceled transfer out of ICU at this time. The pt was then assisted back to bed with minA of 1 and further mobility deferred at this time. Will continue to benefit from skilled PT acutely to progress mobility, activity tolerance, and facilitate return to prior level of independence.         Recommendations for follow up therapy are one component of a multi-disciplinary discharge planning process, led by the attending physician.  Recommendations may be updated based on patient status, additional functional criteria and insurance authorization.  Follow Up Recommendations Home health PT    Assistance Recommended at Discharge Frequent or constant Supervision/Assistance  Patient can return home with the following  A little help with walking and/or transfers;A little help with  bathing/dressing/bathroom;Assistance with cooking/housework;Direct supervision/assist for medications management;Direct supervision/assist for financial management;Assist for transportation;Help with stairs or ramp for entrance    Equipment Recommendations Rolling walker (2 wheels);BSC/3in1  Recommendations for Other Services       Functional Status Assessment Patient has had a recent decline in their functional status and demonstrates the ability to make significant improvements in function in a reasonable and predictable amount of time.     Precautions / Restrictions Precautions Precautions: Fall Precaution Comments: watch heart rhythm with activity Restrictions Weight Bearing Restrictions: No      Mobility  Bed Mobility Overal bed mobility: Needs Assistance Bed Mobility: Supine to Sit, Sit to Sidelying, Rolling Rolling: Supervision   Supine to sit: Min guard   Sit to sidelying: Supervision General bed mobility comments: pt able to come to sitting without assist, educated on use of log roll or pillow to brace for future transfers. completed log roll with instructions but no assist to return to supine    Transfers Overall transfer level: Needs assistance   Transfers: Sit to/from Stand, Bed to chair/wheelchair/BSC Sit to Stand: Min guard   Step pivot transfers: Min assist, +2 physical assistance       General transfer comment: pt powers up to standing with minG but no physical assist, minA to steady in standing. He then took steps to [facility] and back to bed after seated rest. bilateral HHA for safely    Ambulation/Gait Ambulation/Gait assistance: Min assist Gait Distance (Feet): 4 Feet Assistive device: 1 person hand held assist Gait Pattern/deviations: Step-to pattern Gait velocity: decreased     General Gait Details: small steps from WC back to EOB and then lateral steps along  EOB, pt into VT with activity, returned to NSR initially after seated rest, then maintaining  after return to bed.     Balance Overall balance assessment: Needs assistance Sitting-balance support: Bilateral upper extremity supported Sitting balance-Leahy Scale: Fair Sitting balance - Comments: bracing with BUE support   Standing balance support: Single extremity supported Standing balance-Leahy Scale: Fair Standing balance comment: able to take steps with single UE support throuh HHA                             Pertinent Vitals/Pain Pain Assessment Pain Assessment: 0-10 Pain Score: 6  Pain Location: ribs with movement Pain Descriptors / Indicators: Aching, Discomfort Pain Intervention(s): Limited activity within patient's tolerance, Monitored during session, Repositioned    Home Living Family/patient expects to be discharged to:: Private residence Living Arrangements: Children;Parent Available Help at Discharge: Family;Available 24 hours/day Type of Home: House Home Access: Stairs to enter Entrance Stairs-Rails: None Entrance Stairs-Number of Steps: 1   Home Layout: One level Home Equipment: None      Prior Function Prior Level of Function : Independent/Modified Independent;Working/employed;Driving             Mobility Comments: pt reports independent with all mobility, 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 very active with work ADLs Comments: independent     Hand Dominance   Dominant Hand: Left    Extremity/Trunk Assessment   Upper Extremity Assessment Upper Extremity Assessment: Overall WFL for tasks assessed    Lower Extremity Assessment Lower Extremity Assessment: Overall WFL for tasks assessed    Cervical / Trunk Assessment Cervical / Trunk Assessment: Other exceptions Cervical / Trunk Exceptions: tenderness at chest/ribs from CPR  Communication   Communication: No difficulties  Cognition Arousal/Alertness: Awake/alert Behavior During Therapy: WFL for tasks assessed/performed Overall Cognitive Status:  Within Functional Limits for tasks assessed                                 General Comments: pt calm, answering questions appropriately, able to follow all instructions. at times conversation limited by SOB        General Comments General comments (skin integrity, edema, etc.): SpO2 stable on RA, BP stable with transfer. heart rhythm going into VT during transfer, planned transfer out of ICU canceled.        Assessment/Plan    PT Assessment Patient needs continued PT services  PT Problem List Cardiopulmonary status limiting activity;Decreased mobility;Decreased balance;Decreased activity tolerance       PT Treatment Interventions DME instruction;Gait training;Stair training;Functional mobility training;Therapeutic activities;Therapeutic exercise;Balance training;Patient/family education    PT Goals (Current goals can be found in the Care Plan section)  Acute Rehab PT Goals Patient Stated Goal: return to independence and work PT Goal Formulation: With patient Time For Goal Achievement: 12/05/21 Potential to Achieve Goals: Good    Frequency Min 3X/week        AM-PAC PT "6 Clicks" Mobility  Outcome Measure Help needed turning from your back to your side while in a flat bed without using bedrails?: A Little Help needed moving from lying on your back to sitting on the side of a flat bed without using bedrails?: A Little Help needed moving to and from a bed to a chair (including a wheelchair)?: A Little Help needed standing up from a chair using your arms (e.g., wheelchair or bedside chair)?: A  Little Help needed to walk in hospital room?: Total (unable to ambulate 20 ft today) Help needed climbing 3-5 steps with a railing? : Total 6 Click Score: 14    End of Session   Activity Tolerance: Patient tolerated treatment well;Treatment limited secondary to medical complications (Comment) Patient left: in bed;with call bell/phone within reach;with nursing/sitter in  room;with family/visitor present Nurse Communication: Mobility status PT Visit Diagnosis: Other abnormalities of gait and mobility (R26.89)    Time: [id number] PT Time Calculation (min) (ACUTE ONLY): 23 min   Charges:   PT Evaluation $PT Eval Moderate Complexity: 1 Mod PT Treatments $Therapeutic Exercise: 8-22 mins        Vickki MuffKatie Zhou, PT, DPT   Acute Rehabilitation Department Pager #: [phone]  Ronnie DerbyKatie F Zhou 11/21/2021, 6:30 PM

## 2021-11-21 NOTE — Progress Notes (Signed)
  Echocardiogram 2D Echocardiogram has been performed.  Hayden Ramirez F 11/21/2021, 11:14 AM

## 2021-11-21 NOTE — Progress Notes (Signed)
[facility] ADULT ICU REPLACEMENT PROTOCOL   The patient does apply for the [facility] Adult ICU Electrolyte Replacment Protocol based on the criteria listed below:   1.Exclusion criteria: TCTS patients, ECMO patients, and Dialysis patients 2. Is GFR >/= 30 ml/min? Yes.    Patient's GFR today is >60 3. Is SCr </= 2? Yes.   Patient's SCr is 1.19 mg/dL 4. Did SCr increase >/= 0.5 in 24 hours? No. 5.Pt's weight >40kg  Yes.   6. Abnormal electrolyte(s): Mag 1.5  7. Electrolytes replaced per protocol 8.  Call MD STAT for K+ </= 2.5, Phos </= 1, or Mag </= 1 Physician:  Dr. Carol Ada, [place] Grumbling 11/21/2021 4:23 AM

## 2021-11-21 NOTE — Progress Notes (Signed)
11/21/2021   I have seen and evaluated the patient for cardiac arrest.  S:  Doing well off vent.  Some chest soreness.  O: Blood pressure 125/79, pulse 76, temperature 99 F (37.2 C), resp. rate (!) 22, height 5\' 11"  (1.803 m), weight 77.5 kg, SpO2 97 %.  No distress Lungs with rhonci bases Abdomen soft Ext warm  K/Mg low repleted Cr okay WBC up slightly  A:  VF arrest 2/2 in stent thrombosis s/p DES ?Aspiration pneumonitis, improving without abx Tobacco abuse  P:  - PT/OT consults, IS progressive mobility - Wean O2 to sats > 90% - Post cath care and GDMT per cardiology - Okay to transfer to progressive, PCCM available PRN  MD [facility] Prefer epic messenger for cross cover needs If after hours, please call E-link

## 2021-11-21 NOTE — Progress Notes (Addendum)
Developed recurrent VT prior to transfer out of ICU. Amiodarone restarted. Will monitor in ICU one more day.   Laurance Flatten, MD

## 2021-11-22 DIAGNOSIS — I2121 ST elevation (STEMI) myocardial infarction involving left circumflex coronary artery: Secondary | ICD-10-CM | POA: Diagnosis not present

## 2021-11-22 LAB — CBC
HCT: 44 % (ref 39.0–52.0)
Hemoglobin: 15.8 g/dL (ref 13.0–17.0)
MCH: 31.3 pg (ref 26.0–34.0)
MCHC: 35.9 g/dL (ref 30.0–36.0)
MCV: 87.1 fL (ref 80.0–100.0)
Platelets: 206 10*3/uL (ref 150–400)
RBC: 5.05 MIL/uL (ref 4.22–5.81)
RDW: 14 % (ref 11.5–15.5)
WBC: 5.5 10*3/uL (ref 4.0–10.5)
nRBC: 0 % (ref 0.0–0.2)

## 2021-11-22 LAB — BASIC METABOLIC PANEL
Anion gap: 11 (ref 5–15)
BUN: 17 mg/dL (ref 6–20)
CO2: 17 mmol/L — ABNORMAL LOW (ref 22–32)
Calcium: 9 mg/dL (ref 8.9–10.3)
Chloride: 102 mmol/L (ref 98–111)
Creatinine, Ser: 1.1 mg/dL (ref 0.61–1.24)
GFR, Estimated: >60 mL/min (ref >60)
Glucose, Bld: 113 mg/dL — ABNORMAL HIGH (ref 70–99)
Potassium: 4 mmol/L (ref 3.5–5.1)
Sodium: 130 mmol/L — ABNORMAL LOW (ref 135–145)

## 2021-11-22 LAB — MAGNESIUM: Magnesium: 1.9 mg/dL (ref 1.7–2.4)

## 2021-11-22 LAB — PHOSPHORUS: Phosphorus: 1.9 mg/dL — ABNORMAL LOW (ref 2.5–4.6)

## 2021-11-22 MED ORDER — SODIUM PHOSPHATES 45 MMOLE/15ML IV SOLN
30.0000 mmol | Freq: Once | INTRAVENOUS | Status: AC
  Administered 2021-11-22: 30 mmol via INTRAVENOUS
  Filled 2021-11-22: qty 10

## 2021-11-22 MED ORDER — EMPAGLIFLOZIN 10 MG PO TABS
10.0000 mg | ORAL_TABLET | Freq: Every day | ORAL | Status: DC
  Administered 2021-11-22 – 2021-11-23 (×2): 10 mg via ORAL
  Filled 2021-11-22 (×2): qty 1

## 2021-11-22 MED ORDER — POLYVINYL ALCOHOL 1.4 % OP SOLN: 1.0000 [drp] | OPHTHALMIC | Status: DC | PRN

## 2021-11-22 MED ORDER — LOSARTAN POTASSIUM 25 MG PO TABS
25.0000 mg | ORAL_TABLET | Freq: Every day | ORAL | Status: DC
  Administered 2021-11-22: 25 mg via ORAL
  Filled 2021-11-22: qty 1

## 2021-11-22 MED ORDER — MAGNESIUM SULFATE 2 GM/50ML IV SOLN
2.0000 g | Freq: Once | INTRAVENOUS | Status: AC
  Administered 2021-11-22: 2 g via INTRAVENOUS
  Filled 2021-11-22: qty 50

## 2021-11-22 MED ORDER — METOPROLOL SUCCINATE ER 25 MG PO TB24
25.0000 mg | ORAL_TABLET | Freq: Every day | ORAL | Status: DC
  Administered 2021-11-22: 25 mg via ORAL
  Filled 2021-11-22: qty 1

## 2021-11-22 NOTE — Progress Notes (Signed)
   11/22/21 1430  Clinical Encounter Type  Visited With Patient and family together  Visit Type Follow-up  Referral From Chaplain (Follow-Up to previous family visit)  Consult/Referral To Chaplain Benetta Spar)  Recommendations Follow-Up to previous family visit   Chaplain followed up with Hayden Ramirez at patient's bedside. Several family members were present during conversation. Chaplain offered encouragement for continued good health and blessings upon his family. [address] [place], [place] [phone].

## 2021-11-22 NOTE — Progress Notes (Signed)
[facility] ADULT ICU REPLACEMENT PROTOCOL   The patient does apply for the [facility] Adult ICU Electrolyte Replacment Protocol based on the criteria listed below:   1.Exclusion criteria: TCTS patients, ECMO patients, and Dialysis patients 2. Is GFR >/= 30 ml/min? Yes.    Patient's GFR today is >60 3. Is SCr </= 2? Yes.   Patient's SCr is 1.10 mg/dL 4. Did SCr increase >/= 0.5 in 24 hours? No. 5.Pt's weight >40kg  Yes.   6. Abnormal electrolyte(s): Mag 1.9, Phos 1.9  7. Electrolytes replaced per protocol 8.  Call MD STAT for K+ </= 2.5, Phos </= 1, or Mag </= 1 Physician: Dr. Ronnald Ramp, Lilia Argue 11/22/2021 5:58 AM

## 2021-11-22 NOTE — Progress Notes (Signed)
Progress Note  Patient Name: Hayden Ramirez Date of Encounter: 11/22/2021  [facility] Cardiologist: None   Subjective   No acute events overnight.  Had some VT yesterday so kept in unit.  Today, no issues other than some minor phlegm.  Inpatient Medications    Scheduled Meds:  aspirin EC  81 mg Oral Daily   atorvastatin  80 mg Oral Daily   Chlorhexidine Gluconate Cloth  6 each Topical Daily   docusate sodium  100 mg Oral BID   mouth rinse  15 mL Mouth Rinse BID   metoprolol succinate  25 mg Oral Daily   pantoprazole  40 mg Oral Daily   pneumococcal 20-valent conjugate vaccine  0.5 mL Intramuscular Tomorrow-1000   polyethylene glycol  17 g Per Tube Daily   sodium chloride flush  3 mL Intravenous Q12H   ticagrelor  90 mg Oral BID   Continuous Infusions:  sodium chloride     sodium chloride     amiodarone 30 mg/hr (11/22/21 0400)   magnesium sulfate bolus IVPB     sodium phosphate  Dextrose 5% IVPB     PRN Meds: sodium chloride, acetaminophen **OR** acetaminophen (TYLENOL) oral liquid 160 mg/5 mL **OR** acetaminophen, ondansetron (ZOFRAN) IV, sodium chloride flush   Vital Signs    Vitals:   11/22/21 0400 11/22/21 0500 11/22/21 0600 11/22/21 0700  BP: (!) 126/94     Pulse: 79 78 83 79  Resp: (!) 22 (!) 27 (!) 27 (!) 28  Temp:      TempSrc:      SpO2: 99% 100% 94% 93%  Weight:      Height:        Intake/Output Summary (Last 24 hours) at 11/22/2021 0738 Last data filed at 11/22/2021 0700 Gross per 24 hour  Intake 590.03 ml  Output 1330 ml  Net -739.97 ml      11/21/2021    4:00 AM 11/20/2021    6:00 AM 03/28/2016   10:13 AM  Last 3 Weights  Weight (lbs) 170 lb 13.7 oz 170 lb 3.1 oz 174 lb 9.6 oz  Weight (kg) 77.5 kg 77.2 kg 79.198 kg      Telemetry    NSR with NSVT yesterday- Personally Reviewed  ECG    No new tracing today - Personally Reviewed  Physical Exam   GEN: Awake, alert, comfortable; scleral hemorrhages Neck: No JVD Cardiac:  RRR, no murmurs Respiratory: Rhonchorous GI: Soft, nontender, non-distended  MS: No edema; No deformity. Right radial access site c/d/i Neuro:  Nonfocal  Psych: Normal affect   Labs    High Sensitivity Troponin:   Recent Labs  Lab 11/20/21 0225 11/20/21 0552  TROPONINIHS 29* 13,662*     Chemistry Recent Labs  Lab 11/20/21 0225 11/20/21 0231 11/20/21 1004 11/21/21 0200 11/22/21 0112  NA 140   < > 144 138 130*  K 2.7*   < > 4.0 3.9 4.0  CL 108   < > 108 106 102  CO2 <7*  --  27 24 17*  GLUCOSE 287*   < > 120* 99 113*  BUN 16   < > 18 18 17   CREATININE 1.62*   < > 1.43* 1.19 1.10  CALCIUM 7.3*  --  8.3* 8.4* 9.0  MG 2.1  --   --  1.5* 1.9  PROT 4.3*  --   --   --   --   ALBUMIN 2.5*  --   --   --   --  AST 170*  --   --   --   --   ALT 134*  --   --   --   --   ALKPHOS 42  --   --   --   --   BILITOT 0.2*  --   --   --   --   GFRNONAA 50*  --  59* >60 >60  ANIONGAP NOT CALCULATED  --  9 8 11    < > = values in this interval not displayed.    Lipids  Recent Labs  Lab 11/20/21 0225  CHOL 114  TRIG 96  HDL 28*  LDLCALC 67  CHOLHDL 4.1    Hematology Recent Labs  Lab 11/20/21 1004 11/21/21 0342 11/22/21 0112  WBC 6.2 11.8* 5.5  RBC 5.20 4.47 5.05  HGB 16.3 14.0 15.8  HCT 46.7 39.7 44.0  MCV 89.8 88.8 87.1  MCH 31.3 31.3 31.3  MCHC 34.9 35.3 35.9  RDW 14.0 14.0 14.0  PLT 255 225 206   Thyroid No results for input(s): TSH, FREET4 in the last 168 hours.  BNPNo results for input(s): BNP, PROBNP in the last 168 hours.  DDimer No results for input(s): DDIMER in the last 168 hours.   Radiology    EEG adult  Result Date: 11/20/2021 Derek Jack, MD     11/20/2021  8:32 PM Routine EEG Report Hayden Ramirez is a 56 y.o. male with a history of cardiac arrest who is undergoing an EEG to evaluate for seizures. Report: This EEG was acquired with electrodes placed according to the International 10-20 electrode system (including Fp1, Fp2, F3, F4, C3, C4,  P3, P4, O1, O2, T3, T4, T5, T6, A1, A2, Fz, Cz, Pz). The following electrodes were missing or displaced: none. The best background was continuous activity at 7 Hz. This activity is reactive to stimulation. Sleep architecture was identified by K complexes and sleep spindles. There was no focal slowing. There were no interictal epileptiform discharges. There were no electrographic seizures identified. Photic stimulation and hyperventilation were not performed. Impression and clinical correlation: This EEG was obtained while sedated on fentanyl and is abnormal due to mild diffuse slowing indicative of global cerebral dysfunction. Epileptiform abnormalities were not seen during this recording. Su Monks, MD [facility] [phone] If 7pm- 7am, please page neurology on call as listed in [place].   ECHOCARDIOGRAM COMPLETE  Result Date: 11/20/2021    ECHOCARDIOGRAM REPORT   Patient Name:   Hayden Ramirez Date of Exam: 11/20/2021 Medical Rec #:  [id number]            Height:       71.0 in Accession #:    [id number]           Weight:       170.2 lb Date of Birth:  September 01, 1965             BSA:          1.969 m Patient Age:    63 years             BP:           167/140 mmHg Patient Gender: M                    HR:           115 bpm. Exam Location:  Inpatient Procedure: 2D Echo, Cardiac Doppler and Color Doppler Indications:    Cardiac arrest  History:  Patient has prior history of Echocardiogram examinations, most                 recent 05/17/2016. CAD; Risk Factors:Dyslipidemia.  Sonographer:    Luisa Hart RDCS Referring Phys: [id number] Estill Cotta  Sonographer Comments: Suboptimal apical window, suboptimal subcostal window, no subcostal window and Technically difficult study due to poor echo windows. IMPRESSIONS  1. Very limited echo given cardiac arrest.  2. Grossly normal left ventricular ejection fraction, by estimation, is 60 to 65%. The left ventricle has normal function. Left ventricular  endocardial border not optimally defined to evaluate regional wall motion. Left ventricular diastolic function could not be evaluated.  3. Right ventricular systolic function was not well visualized. The right ventricular size is not well visualized.  4. The mitral valve is grossly normal. No evidence of mitral valve regurgitation.  5. The aortic valve was not assessed. Aortic valve regurgitation is not visualized. Comparison(s): No prior Echocardiogram. Conclusion(s)/Recommendation(s): Consider repeat limited echo when the patient is more stable. FINDINGS  Left Ventricle: Left ventricular ejection fraction, by estimation, is 60 to 65%. The left ventricle has normal function. Left ventricular endocardial border not optimally defined to evaluate regional wall motion. The left ventricular internal cavity size was normal in size. Suboptimal image quality limits for assessment of left ventricular hypertrophy. Left ventricular diastolic function could not be evaluated. Right Ventricle: The right ventricular size is not well visualized. Right vetricular wall thickness was not assessed. Right ventricular systolic function was not well visualized. Left Atrium: Left atrial size was not assessed. Right Atrium: Right atrial size was not assessed. Pericardium: There is no evidence of pericardial effusion. Mitral Valve: The mitral valve is grossly normal. No evidence of mitral valve regurgitation. Tricuspid Valve: The tricuspid valve is not assessed. Tricuspid valve regurgitation is not demonstrated. Aortic Valve: The aortic valve was not assessed. Aortic valve regurgitation is not visualized. Pulmonic Valve: The pulmonic valve was not assessed. Pulmonic valve regurgitation is not visualized. Aorta: Aortic root could not be assessed. Venous: The inferior vena cava was not well visualized. IAS/Shunts: The interatrial septum was not assessed.   Diastology LV e' medial:  6.43 cm/s LV e' lateral: 15.60 cm/s  Lyman Bishop MD  Electronically signed by Lyman Bishop MD Signature Date/Time: 11/20/2021/2:47:01 PM    Final    ECHOCARDIOGRAM LIMITED  Result Date: 11/21/2021    ECHOCARDIOGRAM LIMITED REPORT   Patient Name:   Hayden Ramirez Date of Exam: 11/21/2021 Medical Rec #:  [id number]            Height:       71.0 in Accession #:    [id number]           Weight:       170.9 lb Date of Birth:  [birth year]             BSA:          1.972 m Patient Age:    44 years             BP:           129/75 mmHg Patient Gender: M                    HR:           78 bpm. Exam Location:  Inpatient Procedure: 2D Echo, Limited Echo, Color Doppler, Cardiac Doppler and            Intracardiac Opacification Agent Indications:  Cardiac arrest  History:        Patient has prior history of Echocardiogram examinations, most                 recent 11/20/2021. CAD; Risk Factors:Dyslipidemia.  Sonographer:    Merrie Roof RDCS Referring Phys: [id number] Greer Ee [place]  Sonographer Comments: Technically difficult study due to poor echo windows. IMPRESSIONS  1. Posterior lateral wall hypokinesis . Left ventricular ejection fraction, by estimation, is 45 to 50%. The left ventricle has mildly decreased function. The left ventricle demonstrates regional wall motion abnormalities (see scoring diagram/findings for description).  2. Right ventricular systolic function is normal. The right ventricular size is normal.  3. Left atrial size was mildly dilated.  4. The mitral valve is normal in structure. No evidence of mitral valve regurgitation. No evidence of mitral stenosis.  5. The aortic valve is tricuspid. There is mild calcification of the aortic valve. Aortic valve regurgitation is not visualized. Aortic valve sclerosis is present, with no evidence of aortic valve stenosis.  6. The inferior vena cava is normal in size with greater than 50% respiratory variability, suggesting right atrial pressure of 3 mmHg. FINDINGS  Left Ventricle: Posterior lateral wall  hypokinesis. Left ventricular ejection fraction, by estimation, is 45 to 50%. The left ventricle has mildly decreased function. The left ventricle demonstrates regional wall motion abnormalities. Definity contrast agent was given IV to delineate the left ventricular endocardial borders. The left ventricular internal cavity size was normal in size. There is no left ventricular hypertrophy. Right Ventricle: The right ventricular size is normal. No increase in right ventricular wall thickness. Right ventricular systolic function is normal. Left Atrium: Left atrial size was mildly dilated. Right Atrium: Right atrial size was normal in size. Pericardium: There is no evidence of pericardial effusion. Mitral Valve: The mitral valve is normal in structure. No evidence of mitral valve stenosis. Tricuspid Valve: The tricuspid valve is normal in structure. Tricuspid valve regurgitation is not demonstrated. No evidence of tricuspid stenosis. Aortic Valve: The aortic valve is tricuspid. There is mild calcification of the aortic valve. Aortic valve regurgitation is not visualized. Aortic valve sclerosis is present, with no evidence of aortic valve stenosis. Pulmonic Valve: The pulmonic valve was normal in structure. Pulmonic valve regurgitation is not visualized. No evidence of pulmonic stenosis. Aorta: The aortic root is normal in size and structure. Venous: The inferior vena cava is normal in size with greater than 50% respiratory variability, suggesting right atrial pressure of 3 mmHg. IAS/Shunts: No atrial level shunt detected by color flow Doppler. LEFT VENTRICLE PLAX 2D LVIDd:         4.90 cm   Diastology LVIDs:         3.70 cm   LV e' medial:    8.70 cm/s LV PW:         0.90 cm   LV E/e' medial:  7.6 LV IVS:        0.70 cm   LV e' lateral:   12.60 cm/s LVOT diam:     2.10 cm   LV E/e' lateral: 5.3 LVOT Area:     3.46 cm  LEFT ATRIUM         Index LA diam:    3.90 cm 1.98 cm/m   AORTA Ao Root diam: 2.80 cm MITRAL VALVE MV  Area (PHT): 4.39 cm    SHUNTS MV Decel Time: 173 msec    Systemic Diam: 2.10 cm MV E velocity: 66.30 cm/s MV A  velocity: 71.70 cm/s MV E/A ratio:  0.92 Jenkins Rouge MD Electronically signed by Jenkins Rouge MD Signature Date/Time: 11/21/2021/11:59:19 AM    Final     Cardiac Studies   LHC 11/20/21:   Mid RCA lesion is 15% stenosed.   1st Mrg lesion is 100% stenosed.   A stent was successfully placed.   Post intervention, there is a 0% residual stenosis.   LV end diastolic pressure is severely elevated.   1.  Very late stent thrombosis of prior obtuse marginal stent treated with 1 drug-eluting stent. 2.  Patent mid right coronary artery stent with mild in-stent restenosis with mild diffuse disease elsewhere. 3.  LVEDP of 30 mmHg; the patient received 40 mg of Lasix IV x1 in the cardiac catheterization laboratory 4.  Delay in care due to need to obtain CT scan to rule out intracranial hemorrhage prior to heparinization.   Recommendation: Transferred to ICU for further evaluation and treatment; continue cangrelor for now and with planned load of DAPT later this admission.    Patient Profile     56 y.o. male with history of CAD (had STEMI in 2017  100% stenosis of 1st Mrg lesion (DES successfully placed with a 0% residual stenosis) and 90% stenosis of the Mid RCA (DES placed as well with 0% residual stenosis), HTN, HLD, and tobacco abuse who presented to the ER with acute STEMI with course complicated by VF arrest on arrival s/p CPR with ROSC and cath with very late stent thrombosis of OM1 s/p DES. Now improved and extubated.   Assessment & Plan    #STEMI: #Known CAD with history of prior STEMI #VF Arrest: Patient with known history of CAD with prior STEMI in 2017 s/p DES to OM1 (culprit lesion) and RCA who presented with acute inferolateral STEMI with course complicated by VF arrest on arrival.  -Continue ASA 81mg  daily, brilinta 90mg  BID -Continue lipitor 80mg  daily -Start metop 25mg  XL  daily -Transfer to tele today, likely d/c in AM; UTC and ambulate, PT/OT recs.  #Cardimyopathy; EF 45-50% with posterolateral HK -Cont Toprol XL, start Jardiance 10mg , and losartan 25mg   #VT -Improved, continue BB.  #HLD: -Continue lipitor 80mg  daily -Continue zetia 10mg  daily -Goal LDL<55 -Needs repeat lipids in 6-8weeks  #HTN: -Cont Toprol and start losartan as above  #Tobacco Abuse: -Patient is motivated to quit tobacco now      For questions or updates, please contact [place] Please consult [email or web address] for contact info under        Signed, Early Osmond, MD  11/22/2021, 7:38 AM   Patient ID: Hayden Ramirez, male   DOB: March 04, 1966, 56 y.o.   MRN: [id number]

## 2021-11-22 NOTE — TOC Initial Note (Signed)
Transition of Care [facility]) - Initial/Assessment Note    Patient Details  Name: Hayden Ramirez MRN: [id number] Date of Birth: June 15, 1966  [facility] CM/SW Contact:    Durenda Guthrie, RN Phone Number: 11/22/2021, 3:12 PM  Clinical Narrative:  Patient is 56 yr old male s/p STEMI. Case Manager spoke with Hayden Ramirez concerning recommendation for Home Health therapy and DME. Received permission to contact Home Health agency that will accept his insurance. Referral was called to [facility], they can begin care on or after 11/25/21. DME has been requested from [employer], will be delivered to patient's room. Patient has family support at [place]. TOC Team will continue to follow.    Expected Discharge Plan: Home w Home Health Services Barriers to Discharge: Continued Medical Work up   Patient Goals and CMS Choice     Choice offered to / list presented to : Patient  Expected Discharge Plan and Services Expected Discharge Plan: Home w Home Health Services In-house Referral: NA Discharge Planning Services: CM Consult Post Acute Care Choice: Home Health, Durable Medical Equipment Living arrangements for the past 2 months: Single Family Home                 DME Arranged: 3-N-1, Walker rolling   Date DME Agency Contacted: 11/22/21 Time DME Agency Contacted: [phone] Representative spoke with at DME Agency: [facility] HH Arranged: PT, OT HH Agency: [facility] Date [facility] Agency Contacted: 11/22/21 Time HH Agency Contacted: [phone] Representative spoke with at [facility] Agency: [facility]  Prior Living Arrangements/Services Living arrangements for the past 2 months: Single Family Home Lives with:: Significant Other Patient language and need for interpreter reviewed:: Yes Do you feel safe going back to the place where you live?: Yes      Need for Family Participation in Patient Care: Yes (Comment) Care giver support system in place?: Yes (comment)    Criminal Activity/Legal Involvement Pertinent to Current Situation/Hospitalization: No - Comment as needed  Activities of Daily Living Home Assistive Devices/Equipment: None ADL Screening (condition at time of admission) Patient's cognitive ability adequate to safely complete daily activities?: Yes Is the patient deaf or have difficulty hearing?: No Does the patient have difficulty seeing, even when wearing glasses/contacts?: No Does the patient have difficulty concentrating, remembering, or making decisions?: No Patient able to express need for assistance with ADLs?: Yes Does the patient have difficulty dressing or bathing?: No Independently performs ADLs?: Yes (appropriate for developmental age) Does the patient have difficulty walking or climbing stairs?: No Weakness of Legs: None Weakness of Arms/Hands: None  Permission Sought/Granted         Permission granted to share info w AGENCY: [facility]        Emotional Assessment       Orientation: : Oriented to Self, Oriented to Place, Oriented to  Time, Oriented to Situation Alcohol / Substance Use: Not Applicable Psych Involvement: No (comment)  Admission diagnosis:  STEMI (ST elevation myocardial infarction) [facility]) [I21.3] Patient Active Problem List   Diagnosis Date Noted   STEMI (ST elevation myocardial infarction) (HCC) 11/20/2021   Old MI (myocardial infarction) 03/28/2016   CAD (coronary artery disease) 03/28/2016   Tobacco abuse 02/02/2016   Dyslipidemia, goal LDL below 70 02/02/2016   Acute inferoposterior myocardial infarction (HCC) 01/30/2016   Acute MI, inferoposterior wall, initial episode of care [facility])    PCP:  Pcp, No Pharmacy:   [id number] [place] Otto, [place] [address] [facility] OF [facility] & [address] [place] [address] Phone: [phone] Fax: [phone]     Social Determinants of Health (SDOH) Interventions    Readmission Risk  Interventions     View : No data to display.

## 2021-11-22 NOTE — Progress Notes (Signed)
11/22/2021  Unable to transfer due to prolonged NSVT.  IV amio restarted. Stable off vent, PCCM will be available PRN.  Myrla Halsted MD [place] [facility] Prefer epic messenger for cross cover needs If after hours, please call E-link

## 2021-11-22 NOTE — Progress Notes (Addendum)
Physical Therapy Treatment Patient Details Name: Hayden Ramirez MRN: [id number] DOB: 07-04-1965 Today's Date: 11/22/2021   History of Present Illness The pt is a 57 yo male presenting 5/27 became unresponsive shortly after arrival to ED triage. Pt underwent 8 min CPR and was shocked x2 prior to [place]. Pt intubated after ROSC, EKG showing STEMI. Extubated 5/27. PMH includes: CAD, STEMI s/p DES to 1st marginal lesion and RCA, HTN, HLD, and tobacco use.    PT Comments    Pt is making great progress with mobility, ambulating up to ~240 ft today. Pt was able to progress from using the RW to no AD while ambulating today. However, he did display x1 LOB needing minA to recover when challenging his vestibular system while ambulating without an AD. Pt also with noted increased WOB, but SpO2 not reading well, HR up to 110s with no noted arrhythmias or monitor alarms during session. Will continue to follow acutely. Current recommendations remain appropriate.     Recommendations for follow up therapy are one component of a multi-disciplinary discharge planning process, led by the attending physician.  Recommendations may be updated based on patient status, additional functional criteria and insurance authorization.  Follow Up Recommendations  Home health PT     Assistance Recommended at Discharge Intermittent Supervision/Assistance  Patient can return home with the following A little help with walking and/or transfers;A little help with bathing/dressing/bathroom;Assistance with cooking/housework;Assist for transportation;Help with stairs or ramp for entrance   Equipment Recommendations  BSC/3in1    Recommendations for Other Services       Precautions / Restrictions Precautions Precautions: Fall Precaution Comments: watch heart rhythm with activity Restrictions Weight Bearing Restrictions: No     Mobility  Bed Mobility Overal bed mobility: Modified Independent Bed Mobility: Supine to  Sit, Sit to Supine     Supine to sit: Modified independent (Device/Increase time), HOB elevated Sit to supine: Modified independent (Device/Increase time), HOB elevated   General bed mobility comments: Pt able to perform all bed mobility aspects without assistance, HOB elevated.    Transfers Overall transfer level: Needs assistance Equipment used: None, Rolling walker (2 wheels) Transfers: Sit to/from Stand Sit to Stand: Supervision           General transfer comment: Cues to push up from bed to stand at RW first sit > stand rep. Progressed to no AD on 2nd rep. No LOB, supervision for safety    Ambulation/Gait Ambulation/Gait assistance: Min assist, Min guard Gait Distance (Feet): 240 Feet Assistive device: None, Rolling walker (2 wheels) Gait Pattern/deviations: Step-through pattern, Decreased stride length, Narrow base of support Gait velocity: decreased Gait velocity interpretation: <1.8 ft/sec, indicate of risk for recurrent falls   General Gait Details: Pt ambulating first half with RW, displaying slow but steady gait, no LOB, min guard for safety. Progressed to no AD on way back. Pt with x1 LOB when cued to look up <> down repeatedly, minA to recover, otherwise min guard for safety. Cues to widen BOS to improve stability   Stairs             Wheelchair Mobility    Modified Rankin (Stroke Patients Only)       Balance Overall balance assessment: Needs assistance Sitting-balance support: No upper extremity supported, Feet supported Sitting balance-Leahy Scale: Good     Standing balance support: Bilateral upper extremity supported, No upper extremity supported, During functional activity Standing balance-Leahy Scale: Fair Standing balance comment: Mild instability with x1 LOB when challenging vestibular system  while ambulating without UE support, minA to recover.                            Cognition Arousal/Alertness: Awake/alert Behavior  During Therapy: WFL for tasks assessed/performed Overall Cognitive Status: Within Functional Limits for tasks assessed                                          Exercises Other Exercises Other Exercises: deep squats with hands on sink, 10x    General Comments General comments (skin integrity, edema, etc.): SpO2 with inconsistent wave forms, HR up to 110s with no alarming noted on monitor throughout session      Pertinent Vitals/Pain Pain Assessment Pain Assessment: Faces Faces Pain Scale: Hurts a little bit Pain Location: slight grimace with activity Pain Descriptors / Indicators: Grimacing Pain Intervention(s): Monitored during session, Limited activity within patient's tolerance, Repositioned    Home Living                          Prior Function            PT Goals (current goals can now be found in the care plan section) Acute Rehab PT Goals Patient Stated Goal: return to independence and work PT Goal Formulation: With patient/family Time For Goal Achievement: 12/05/21 Potential to Achieve Goals: Good Progress towards PT goals: Progressing toward goals    Frequency    Min 3X/week      PT Plan Equipment recommendations need to be updated    Co-evaluation              AM-PAC PT "6 Clicks" Mobility   Outcome Measure  Help needed turning from your back to your side while in a flat bed without using bedrails?: None Help needed moving from lying on your back to sitting on the side of a flat bed without using bedrails?: None Help needed moving to and from a bed to a chair (including a wheelchair)?: A Little Help needed standing up from a chair using your arms (e.g., wheelchair or bedside chair)?: A Little Help needed to walk in hospital room?: A Little Help needed climbing 3-5 steps with a railing? : A Little 6 Click Score: 20    End of Session Equipment Utilized During Treatment: Gait belt Activity Tolerance: Patient tolerated  treatment well Patient left: in bed;with call bell/phone within reach;with family/visitor present Nurse Communication: Mobility status PT Visit Diagnosis: Other abnormalities of gait and mobility (R26.89);Unsteadiness on feet (R26.81)     Time: [id number] PT Time Calculation (min) (ACUTE ONLY): 18 min  Charges:  $Gait Training: 8-22 mins                     Moishe Spice, PT, DPT Acute Rehabilitation Services  Pager: [phone] Office: [place] 11/22/2021, 5:26 PM

## 2021-11-23 ENCOUNTER — Telehealth: Payer: Self-pay

## 2021-11-23 ENCOUNTER — Other Ambulatory Visit (HOSPITAL_COMMUNITY): Payer: Self-pay

## 2021-11-23 ENCOUNTER — Encounter (HOSPITAL_COMMUNITY): Payer: Self-pay | Admitting: Internal Medicine

## 2021-11-23 DIAGNOSIS — I2121 ST elevation (STEMI) myocardial infarction involving left circumflex coronary artery: Secondary | ICD-10-CM | POA: Diagnosis not present

## 2021-11-23 LAB — BASIC METABOLIC PANEL
Anion gap: 9 (ref 5–15)
BUN: 17 mg/dL (ref 6–20)
CO2: 20 mmol/L — ABNORMAL LOW (ref 22–32)
Calcium: 8.4 mg/dL — ABNORMAL LOW (ref 8.9–10.3)
Chloride: 101 mmol/L (ref 98–111)
Creatinine, Ser: 1.1 mg/dL (ref 0.61–1.24)
GFR, Estimated: >60 mL/min (ref >60)
Glucose, Bld: 92 mg/dL (ref 70–99)
Potassium: 3.5 mmol/L (ref 3.5–5.1)
Sodium: 130 mmol/L — ABNORMAL LOW (ref 135–145)

## 2021-11-23 LAB — CBC
HCT: 41.6 % (ref 39.0–52.0)
Hemoglobin: 14.9 g/dL (ref 13.0–17.0)
MCH: 30.8 pg (ref 26.0–34.0)
MCHC: 35.8 g/dL (ref 30.0–36.0)
MCV: 86 fL (ref 80.0–100.0)
Platelets: 200 10*3/uL (ref 150–400)
RBC: 4.84 MIL/uL (ref 4.22–5.81)
RDW: 14 % (ref 11.5–15.5)
WBC: 6.8 10*3/uL (ref 4.0–10.5)
nRBC: 0 % (ref 0.0–0.2)

## 2021-11-23 LAB — MAGNESIUM: Magnesium: 2 mg/dL (ref 1.7–2.4)

## 2021-11-23 LAB — PHOSPHORUS: Phosphorus: 2.8 mg/dL (ref 2.5–4.6)

## 2021-11-23 MED ORDER — METOPROLOL SUCCINATE ER 25 MG PO TB24: 37.5000 mg | ORAL_TABLET | Freq: Every day | ORAL | Status: DC

## 2021-11-23 MED ORDER — METOPROLOL SUCCINATE ER 25 MG PO TB24
37.5000 mg | ORAL_TABLET | Freq: Every day | ORAL | 2 refills | Status: DC
  Filled 2021-11-23: qty 45, 30d supply, fill #0

## 2021-11-23 MED ORDER — TICAGRELOR 90 MG PO TABS
90.0000 mg | ORAL_TABLET | Freq: Two times a day (BID) | ORAL | 2 refills | Status: DC
  Filled 2021-11-23: qty 180, 90d supply, fill #0

## 2021-11-23 MED ORDER — ATORVASTATIN CALCIUM 80 MG PO TABS
80.0000 mg | ORAL_TABLET | Freq: Every day | ORAL | 1 refills | Status: DC
  Filled 2021-11-23: qty 90, 90d supply, fill #0

## 2021-11-23 MED ORDER — NITROGLYCERIN 0.4 MG SL SUBL
0.4000 mg | SUBLINGUAL_TABLET | SUBLINGUAL | 2 refills | Status: DC | PRN
  Filled 2021-11-23: qty 25, 14d supply, fill #0

## 2021-11-23 MED ORDER — ASPIRIN 81 MG PO TBEC
81.0000 mg | DELAYED_RELEASE_TABLET | Freq: Every day | ORAL | 2 refills | Status: AC
  Filled 2021-11-23: qty 90, 90d supply, fill #0

## 2021-11-23 MED ORDER — EMPAGLIFLOZIN 10 MG PO TABS
10.0000 mg | ORAL_TABLET | Freq: Every day | ORAL | 0 refills | Status: DC
  Filled 2021-11-23: qty 90, 90d supply, fill #0

## 2021-11-23 MED ORDER — LOSARTAN POTASSIUM 25 MG PO TABS
25.0000 mg | ORAL_TABLET | Freq: Every day | ORAL | 0 refills | Status: DC
  Filled 2021-11-23: qty 90, 90d supply, fill #0

## 2021-11-23 NOTE — Discharge Summary (Addendum)
Discharge Summary    Patient ID: Hayden Ramirez MRN: [id number]; DOB: Feb 28, 1966  Admit date: 11/20/2021 Discharge date: 11/23/2021  PCP:  Pcp, No   [facility] Providers Cardiologist:  Early Osmond, MD     Discharge Diagnoses    Principal Problem:   STEMI (ST elevation myocardial infarction) [facility]) Active Problems:   Tobacco abuse   Dyslipidemia, goal LDL below 70   CAD (coronary artery disease)  Diagnostic Studies/Procedures    Cath: 11/20/21   Mid RCA lesion is 15% stenosed.   1st Mrg lesion is 100% stenosed.   A stent was successfully placed.   Post intervention, there is a 0% residual stenosis.   LV end diastolic pressure is severely elevated.   1.  Very late stent thrombosis of prior obtuse marginal stent treated with 1 drug-eluting stent. 2.  Patent mid right coronary artery stent with mild in-stent restenosis with mild diffuse disease elsewhere. 3.  LVEDP of 30 mmHg; the patient received 40 mg of Lasix IV x1 in the cardiac catheterization laboratory 4.  Delay in care due to need to obtain CT scan to rule out intracranial hemorrhage prior to heparinization.   Recommendation: Transferred to ICU for further evaluation and treatment; continue cangrelor for now and with planned load of DAPT later this admission.  Diagnostic Dominance: Right Intervention    Echo: 11/20/21  IMPRESSIONS     1. Very limited echo given cardiac arrest.   2. Grossly normal left ventricular ejection fraction, by estimation, is  60 to 65%. The left ventricle has normal function. Left ventricular  endocardial border not optimally defined to evaluate regional wall motion.  Left ventricular diastolic function  could not be evaluated.   3. Right ventricular systolic function was not well visualized. The right  ventricular size is not well visualized.   4. The mitral valve is grossly normal. No evidence of mitral valve  regurgitation.   5. The aortic valve was not assessed.  Aortic valve regurgitation is not  visualized.   Comparison(s): No prior Echocardiogram.   Conclusion(s)/Recommendation(s): Consider repeat limited echo when the  patient is more stable.   FINDINGS   Left Ventricle: Left ventricular ejection fraction, by estimation, is 60  to 65%. The left ventricle has normal function. Left ventricular  endocardial border not optimally defined to evaluate regional wall motion.  The left ventricular internal cavity  size was normal in size. Suboptimal image quality limits for assessment of  left ventricular hypertrophy. Left ventricular diastolic function could  not be evaluated.   Right Ventricle: The right ventricular size is not well visualized. Right  vetricular wall thickness was not assessed. Right ventricular systolic  function was not well visualized.   Left Atrium: Left atrial size was not assessed.   Right Atrium: Right atrial size was not assessed.   Pericardium: There is no evidence of pericardial effusion.   Mitral Valve: The mitral valve is grossly normal. No evidence of mitral  valve regurgitation.   Tricuspid Valve: The tricuspid valve is not assessed. Tricuspid valve  regurgitation is not demonstrated.   Aortic Valve: The aortic valve was not assessed. Aortic valve  regurgitation is not visualized.   Pulmonic Valve: The pulmonic valve was not assessed. Pulmonic valve  regurgitation is not visualized.   Aorta: Aortic root could not be assessed.   Venous: The inferior vena cava was not well visualized.   IAS/Shunts: The interatrial septum was not assessed.   Echo: 11/21/21  IMPRESSIONS  1. Posterior lateral wall hypokinesis . Left ventricular ejection  fraction, by estimation, is 45 to 50%. The left ventricle has mildly  decreased function. The left ventricle demonstrates regional wall motion  abnormalities (see scoring diagram/findings  for description).   2. Right ventricular systolic function is normal. The  right ventricular  size is normal.   3. Left atrial size was mildly dilated.   4. The mitral valve is normal in structure. No evidence of mitral valve  regurgitation. No evidence of mitral stenosis.   5. The aortic valve is tricuspid. There is mild calcification of the  aortic valve. Aortic valve regurgitation is not visualized. Aortic valve  sclerosis is present, with no evidence of aortic valve stenosis.   6. The inferior vena cava is normal in size with greater than 50%  respiratory variability, suggesting right atrial pressure of 3 mmHg.   FINDINGS   Left Ventricle: Posterior lateral wall hypokinesis. Left ventricular  ejection fraction, by estimation, is 45 to 50%. The left ventricle has  mildly decreased function. The left ventricle demonstrates regional wall  motion abnormalities. Definity contrast  agent was given IV to delineate the left ventricular endocardial borders.  The left ventricular internal cavity size was normal in size. There is no  left ventricular hypertrophy.   Right Ventricle: The right ventricular size is normal. No increase in  right ventricular wall thickness. Right ventricular systolic function is  normal.   Left Atrium: Left atrial size was mildly dilated.   Right Atrium: Right atrial size was normal in size.   Pericardium: There is no evidence of pericardial effusion.   Mitral Valve: The mitral valve is normal in structure. No evidence of  mitral valve stenosis.   Tricuspid Valve: The tricuspid valve is normal in structure. Tricuspid  valve regurgitation is not demonstrated. No evidence of tricuspid  stenosis.   Aortic Valve: The aortic valve is tricuspid. There is mild calcification  of the aortic valve. Aortic valve regurgitation is not visualized. Aortic  valve sclerosis is present, with no evidence of aortic valve stenosis.   Pulmonic Valve: The pulmonic valve was normal in structure. Pulmonic valve  regurgitation is not visualized. No  evidence of pulmonic stenosis.   Aorta: The aortic root is normal in size and structure.   Venous: The inferior vena cava is normal in size with greater than 50%  respiratory variability, suggesting right atrial pressure of 3 mmHg.   IAS/Shunts: No atrial level shunt detected by color flow Doppler.  _____________   History of Present Illness     BAUDILIO GAVRILOV is a 56 y.o. male with pmh sx for CAD (had STEMI in 2017  100% stenosis of 1st Mrg lesion (DES successfully placed with a 0% residual stenosis) and 90% stenosis of the Mid RCA (DES placed as well with 0% residual stenosis) who was seen 11/20/2021 for the evaluation of acute STEMI. He was doing fine till the evening when he suddenly started to have back tightness and felt cold. Pt arrived to front desk with son, initially walked into lobby then collapsed, falling to the floor at the registration desk. He was coded in the ED for approx 8-10 minutes. VF arrest- ROSC was achieved. He was intubated. Post arrest EKG showed STEMI hence cardiology was consulted. He hit his head as well during the fall, was taken to CT head before LHC. CT head WNL.   Hospital Course     Consultants: PCCM  STEMI: Underwent cardiac catheterization noted above with  late stent thrombosis of prior OM stent treated with PCI/DES x1.  Patent mid RCA stent with mild ISR and other diffuse disease to be treated medically.  Placed on DAPT with aspirin/Brilinta with recommendations for at least 1 year.    VF arrest: Received 8 minutes of CPR with ROSC.  Extubated 5/27.  Treated with IV amiodarone.  This was stopped with stable rhythm.  HFrEF/ICM: Initial echocardiogram read LVEF of 60 to 65% but poor acoustic windows noted.  Repeat limited echo showed LVEF of 45 to 50% with posterior lateral wall hypokinesis, normal RV, mildly dilated left atrium.  LVEDP of 30 mmHg during cath, treated with IV Lasix with improvement. -- continue Toprol, losartan and jardiance    Hyperlipidemia: LDL 67 --On atorvastatin 80 mg daily --Will need LFT/FLP in 8 weeks  Severe hypokalemia: K+ 2.6 on admission, supplemented. Resolved prior to DC  Hypertension: Stable --Continue metoprolol 37.5 mg daily, losartan 25 mg daily  Tobacco abuse: cessation advised   Patient was seen by Dr. Ali Lowe and deemed stable for discharge home. Follow up in the office arranged. Medications sent to the [facility] pharmacy. Educated by pharmD prior to discharge.   Did the patient have an acute coronary syndrome (MI, NSTEMI, STEMI, etc) this admission?:  Yes                               AHA/ACC Clinical Performance & Quality Measures: Aspirin prescribed? - Yes ADP Receptor Inhibitor (Plavix/Clopidogrel, Brilinta/Ticagrelor or Effient/Prasugrel) prescribed (includes medically managed patients)? - Yes Beta Blocker prescribed? - Yes High Intensity Statin (Lipitor 40-80mg  or Crestor 20-40mg ) prescribed? - Yes EF assessed during THIS hospitalization? - Yes For EF <40%, was ACEI/ARB prescribed? - Not Applicable (EF >/= [id number]) For EF <40%, Aldosterone Antagonist (Spironolactone or Eplerenone) prescribed? - Not Applicable (EF >/= [id number]) Cardiac Rehab Phase II ordered (including medically managed patients)? - Yes    The patient will be scheduled for a TOC follow up appointment in 10-14 days.  A message has been sent to the [facility] and Scheduling Pool at the office where the patient should be seen for follow up.  _____________  Discharge Vitals Blood pressure 117/74, pulse 88, temperature 99 F (37.2 C), temperature source Oral, resp. rate 19, height 5\' 11"  (1.803 m), weight 79.4 kg, SpO2 98 %.  Filed Weights   11/20/21 0600 11/21/21 0400 11/22/21 1109  Weight: 77.2 kg 77.5 kg 79.4 kg    Labs & Radiologic Studies    CBC Recent Labs    11/22/21 0112 11/23/21 0237  WBC 5.5 6.8  HGB 15.8 14.9  HCT 44.0 41.6  MCV 87.1 86.0  PLT 206 [id number]   Basic Metabolic Panel Recent Labs     11/22/21 0112 11/23/21 0237  NA 130* 130*  K 4.0 3.5  CL 102 101  CO2 17* 20*  GLUCOSE 113* 92  BUN 17 17  CREATININE 1.10 1.10  CALCIUM 9.0 8.4*  MG 1.9 2.0  PHOS 1.9* 2.8   Liver Function Tests No results for input(s): AST, ALT, ALKPHOS, BILITOT, PROT, ALBUMIN in the last 72 hours. No results for input(s): LIPASE, AMYLASE in the last 72 hours. High Sensitivity Troponin:   Recent Labs  Lab 11/20/21 0225 11/20/21 0552  TROPONINIHS 29* 13,662*    BNP Invalid input(s): POCBNP D-Dimer No results for input(s): DDIMER in the last 72 hours. Hemoglobin A1C No results for input(s): HGBA1C in the last 72 hours. Fasting  Lipid Panel No results for input(s): CHOL, HDL, LDLCALC, TRIG, CHOLHDL, LDLDIRECT in the last 72 hours. Thyroid Function Tests No results for input(s): TSH, T4TOTAL, T3FREE, THYROIDAB in the last 72 hours.  Invalid input(s): FREET3 _____________  CT Head Wo Contrast  Result Date: 11/20/2021 CLINICAL DATA:  Head trauma, abnormal mental status EXAM: CT HEAD WITHOUT CONTRAST TECHNIQUE: Contiguous axial images were obtained from the base of the skull through the vertex without intravenous contrast. RADIATION DOSE REDUCTION: This exam was performed according to the departmental dose-optimization program which includes automated exposure control, adjustment of the mA and/or kV according to patient size and/or use of iterative reconstruction technique. COMPARISON:  None Available. FINDINGS: Brain: No evidence of acute infarction, hemorrhage, cerebral edema, mass, mass effect, or midline shift. No hydrocephalus or extra-axial fluid collection. Hypodensity in the right inferior frontal lobe is consistent with the sequela of a remote infarct. Vascular: No hyperdense vessel. Skull: Normal. Negative for fracture or focal lesion. Sinuses/Orbits: Mucous retention cysts in the right maxillary sinus and frontal sinus. Mucosal thickening in the ethmoid air cells and right sphenoid sinus.  Other: The mastoid air cells are well aerated. Fluid in the nasal cavity is likely related to the patient being intubated. IMPRESSION: No acute intracranial process. Electronically Signed   By: Merilyn Baba M.D.   On: 11/20/2021 02:47   CARDIAC CATHETERIZATION  Result Date: 11/20/2021   Mid RCA lesion is 15% stenosed.   1st Mrg lesion is 100% stenosed.   A stent was successfully placed.   Post intervention, there is a 0% residual stenosis.   LV end diastolic pressure is severely elevated. 1.  Very late stent thrombosis of prior obtuse marginal stent treated with 1 drug-eluting stent. 2.  Patent mid right coronary artery stent with mild in-stent restenosis with mild diffuse disease elsewhere. 3.  LVEDP of 30 mmHg; the patient received 40 mg of Lasix IV x1 in the cardiac catheterization laboratory 4.  Delay in care due to need to obtain CT scan to rule out intracranial hemorrhage prior to heparinization. Recommendation: Transferred to ICU for further evaluation and treatment; continue cangrelor for now and with planned load of DAPT later this admission.   DG Chest Port 1 View  Result Date: 11/20/2021 CLINICAL DATA:  Acute respiratory failure EXAM: PORTABLE CHEST 1 VIEW COMPARISON:  Earlier today FINDINGS: Endotracheal tube with tip between the clavicular heads and carina. The enteric tube reaches the stomach. Artifact from EKG leads and defibrillator pads. Infiltrate at the bilateral lung bases. Nodule at the right lung base which has been present since at least 2014. IMPRESSION: 1. Bilateral lower lobe pneumonia. 2. Unremarkable hardware positioning. 3. Right lower lobe nodule seen since at least 2014. Electronically Signed   By: Jorje Guild M.D.   On: 11/20/2021 07:01   DG Chest Portable 1 View  Result Date: 11/20/2021 CLINICAL DATA:  ET tube placement EXAM: PORTABLE CHEST 1 VIEW COMPARISON:  01/30/2016 FINDINGS: Endotracheal tube is 6 cm above the carina. NG tube enters the stomach. Heart is normal  size. No confluent opacities or effusions. IMPRESSION: Endotracheal tube 6 cm above the carina. No acute cardiopulmonary disease. Electronically Signed   By: Rolm Baptise M.D.   On: 11/20/2021 02:33   DG Abd Portable 1 View  Result Date: 11/20/2021 CLINICAL DATA:  Cardiac arrest, NG tube. EXAM: PORTABLE ABDOMEN - 1 VIEW COMPARISON:  None Available. FINDINGS: The bowel gas pattern is normal. An enteric tube terminates in the stomach. No radio-opaque calculi or  other significant radiographic abnormality are seen. IMPRESSION: Enteric tube terminates in the stomach. Electronically Signed   By: Brett Fairy M.D.   On: 11/20/2021 02:35   EEG adult  Result Date: 11/20/2021 Derek Jack, MD     11/20/2021  8:32 PM Routine EEG Report Sylvain PAM SEATS is a 56 y.o. male with a history of cardiac arrest who is undergoing an EEG to evaluate for seizures. Report: This EEG was acquired with electrodes placed according to the International 10-20 electrode system (including Fp1, Fp2, F3, F4, C3, C4, P3, P4, O1, O2, T3, T4, T5, T6, A1, A2, Fz, Cz, Pz). The following electrodes were missing or displaced: none. The best background was continuous activity at 7 Hz. This activity is reactive to stimulation. Sleep architecture was identified by K complexes and sleep spindles. There was no focal slowing. There were no interictal epileptiform discharges. There were no electrographic seizures identified. Photic stimulation and hyperventilation were not performed. Impression and clinical correlation: This EEG was obtained while sedated on fentanyl and is abnormal due to mild diffuse slowing indicative of global cerebral dysfunction. Epileptiform abnormalities were not seen during this recording. Su Monks, MD [facility] [phone] If 7pm- 7am, please page neurology on call as listed in [facility].   ECHOCARDIOGRAM COMPLETE  Result Date: 11/20/2021    ECHOCARDIOGRAM REPORT   Patient Name:   HAYDER JUENEMANN  Date of Exam: 11/20/2021 Medical Rec #:  [id number]            Height:       71.0 in Accession #:    [id number]           Weight:       170.2 lb Date of Birth:  16-Aug-1965             BSA:          1.969 m Patient Age:    56 years             BP:           167/140 mmHg Patient Gender: M                    HR:           115 bpm. Exam Location:  Inpatient Procedure: 2D Echo, Cardiac Doppler and Color Doppler Indications:    Cardiac arrest  History:        Patient has prior history of Echocardiogram examinations, most                 recent 05/17/2016. CAD; Risk Factors:Dyslipidemia.  Sonographer:    Luisa Hart RDCS Referring Phys: [id number] Estill Cotta  Sonographer Comments: Suboptimal apical window, suboptimal subcostal window, no subcostal window and Technically difficult study due to poor echo windows. IMPRESSIONS  1. Very limited echo given cardiac arrest.  2. Grossly normal left ventricular ejection fraction, by estimation, is 60 to 65%. The left ventricle has normal function. Left ventricular endocardial border not optimally defined to evaluate regional wall motion. Left ventricular diastolic function could not be evaluated.  3. Right ventricular systolic function was not well visualized. The right ventricular size is not well visualized.  4. The mitral valve is grossly normal. No evidence of mitral valve regurgitation.  5. The aortic valve was not assessed. Aortic valve regurgitation is not visualized. Comparison(s): No prior Echocardiogram. Conclusion(s)/Recommendation(s): Consider repeat limited echo when the patient is more stable. FINDINGS  Left Ventricle: Left ventricular ejection fraction, by estimation,  is 60 to 65%. The left ventricle has normal function. Left ventricular endocardial border not optimally defined to evaluate regional wall motion. The left ventricular internal cavity size was normal in size. Suboptimal image quality limits for assessment of left ventricular hypertrophy. Left ventricular  diastolic function could not be evaluated. Right Ventricle: The right ventricular size is not well visualized. Right vetricular wall thickness was not assessed. Right ventricular systolic function was not well visualized. Left Atrium: Left atrial size was not assessed. Right Atrium: Right atrial size was not assessed. Pericardium: There is no evidence of pericardial effusion. Mitral Valve: The mitral valve is grossly normal. No evidence of mitral valve regurgitation. Tricuspid Valve: The tricuspid valve is not assessed. Tricuspid valve regurgitation is not demonstrated. Aortic Valve: The aortic valve was not assessed. Aortic valve regurgitation is not visualized. Pulmonic Valve: The pulmonic valve was not assessed. Pulmonic valve regurgitation is not visualized. Aorta: Aortic root could not be assessed. Venous: The inferior vena cava was not well visualized. IAS/Shunts: The interatrial septum was not assessed.   Diastology LV e' medial:  6.43 cm/s LV e' lateral: 15.60 cm/s  Lyman Bishop MD Electronically signed by Lyman Bishop MD Signature Date/Time: 11/20/2021/2:47:01 PM    Final    ECHOCARDIOGRAM LIMITED  Result Date: 11/21/2021    ECHOCARDIOGRAM LIMITED REPORT   Patient Name:   CORDALE FILSINGER Date of Exam: 11/21/2021 Medical Rec #:  [id number]            Height:       71.0 in Accession #:    [id number]           Weight:       170.9 lb Date of Birth:  [birth year]             BSA:          1.972 m Patient Age:    9 years             BP:           129/75 mmHg Patient Gender: M                    HR:           78 bpm. Exam Location:  Inpatient Procedure: 2D Echo, Limited Echo, Color Doppler, Cardiac Doppler and            Intracardiac Opacification Agent Indications:    Cardiac arrest  History:        Patient has prior history of Echocardiogram examinations, most                 recent 11/20/2021. CAD; Risk Factors:Dyslipidemia.  Sonographer:    Merrie Roof RDCS Referring Phys: [id number] Greer Ee [place]   Sonographer Comments: Technically difficult study due to poor echo windows. IMPRESSIONS  1. Posterior lateral wall hypokinesis . Left ventricular ejection fraction, by estimation, is 45 to 50%. The left ventricle has mildly decreased function. The left ventricle demonstrates regional wall motion abnormalities (see scoring diagram/findings for description).  2. Right ventricular systolic function is normal. The right ventricular size is normal.  3. Left atrial size was mildly dilated.  4. The mitral valve is normal in structure. No evidence of mitral valve regurgitation. No evidence of mitral stenosis.  5. The aortic valve is tricuspid. There is mild calcification of the aortic valve. Aortic valve regurgitation is not visualized. Aortic valve sclerosis is present, with no evidence of aortic valve stenosis.  6. The inferior  vena cava is normal in size with greater than 50% respiratory variability, suggesting right atrial pressure of 3 mmHg. FINDINGS  Left Ventricle: Posterior lateral wall hypokinesis. Left ventricular ejection fraction, by estimation, is 45 to 50%. The left ventricle has mildly decreased function. The left ventricle demonstrates regional wall motion abnormalities. Definity contrast agent was given IV to delineate the left ventricular endocardial borders. The left ventricular internal cavity size was normal in size. There is no left ventricular hypertrophy. Right Ventricle: The right ventricular size is normal. No increase in right ventricular wall thickness. Right ventricular systolic function is normal. Left Atrium: Left atrial size was mildly dilated. Right Atrium: Right atrial size was normal in size. Pericardium: There is no evidence of pericardial effusion. Mitral Valve: The mitral valve is normal in structure. No evidence of mitral valve stenosis. Tricuspid Valve: The tricuspid valve is normal in structure. Tricuspid valve regurgitation is not demonstrated. No evidence of tricuspid stenosis.  Aortic Valve: The aortic valve is tricuspid. There is mild calcification of the aortic valve. Aortic valve regurgitation is not visualized. Aortic valve sclerosis is present, with no evidence of aortic valve stenosis. Pulmonic Valve: The pulmonic valve was normal in structure. Pulmonic valve regurgitation is not visualized. No evidence of pulmonic stenosis. Aorta: The aortic root is normal in size and structure. Venous: The inferior vena cava is normal in size with greater than 50% respiratory variability, suggesting right atrial pressure of 3 mmHg. IAS/Shunts: No atrial level shunt detected by color flow Doppler. LEFT VENTRICLE PLAX 2D LVIDd:         4.90 cm   Diastology LVIDs:         3.70 cm   LV e' medial:    8.70 cm/s LV PW:         0.90 cm   LV E/e' medial:  7.6 LV IVS:        0.70 cm   LV e' lateral:   12.60 cm/s LVOT diam:     2.10 cm   LV E/e' lateral: 5.3 LVOT Area:     3.46 cm  LEFT ATRIUM         Index LA diam:    3.90 cm 1.98 cm/m   AORTA Ao Root diam: 2.80 cm MITRAL VALVE MV Area (PHT): 4.39 cm    SHUNTS MV Decel Time: 173 msec    Systemic Diam: 2.10 cm MV E velocity: 66.30 cm/s MV A velocity: 71.70 cm/s MV E/A ratio:  0.92 Charlton Haws MD Electronically signed by Charlton Haws MD Signature Date/Time: 11/21/2021/11:59:19 AM    Final     Disposition   Pt is being discharged home today in good condition.  Follow-up Plans & Appointments     Follow-up Information     [facility], [facility] Follow up.   Specialty: Home Health Services Why: a representtive from [facility] will contact you to arrange start Contact information: [address] [place] [address] [phone]         Levi Aland, NP Follow up on 12/01/2021.   Specialty: Nurse Practitioner Why: at 10:55am for your follow up appt with Dr. Romie Levee' NP Contact information: [address] [place] [address] [phone]                Discharge Instructions     Amb  Referral to Cardiac Rehabilitation   Complete by: As directed    Diagnosis:  Coronary Stents STEMI     After  initial evaluation and assessments completed: Virtual Based Care may be provided alone or in conjunction with Phase 2 Cardiac Rehab based on patient barriers.: Yes   Call MD for:  difficulty breathing, headache or visual disturbances   Complete by: As directed    Call MD for:  persistant dizziness or light-headedness   Complete by: As directed    Call MD for:  redness, tenderness, or signs of infection (pain, swelling, redness, odor or green/yellow discharge around incision site)   Complete by: As directed    Diet - low sodium heart healthy   Complete by: As directed    Discharge instructions   Complete by: As directed    Radial Site Care Refer to this sheet in the next few weeks. These instructions provide you with information on caring for yourself after your procedure. Your caregiver may also give you more specific instructions. Your treatment has been planned according to current medical practices, but problems sometimes occur. Call your caregiver if you have any problems or questions after your procedure. HOME CARE INSTRUCTIONS You may shower the day after the procedure. Remove the bandage (dressing) and gently wash the site with plain soap and water. Gently pat the site dry.  Do not apply powder or lotion to the site.  Do not submerge the affected site in water for 3 to 5 days.  Inspect the site at least twice daily.  Do not flex or bend the affected arm for 24 hours.  No lifting over 5 pounds (2.3 kg) for 5 days after your procedure.  Do not drive home if you are discharged the same day of the procedure. Have someone else drive you.  You may drive 24 hours after the procedure unless otherwise instructed by your caregiver.  What to expect: Any bruising will usually fade within 1 to 2 weeks.  Blood that collects in the tissue (hematoma) may be painful to the touch. It should  usually decrease in size and tenderness within 1 to 2 weeks.  SEEK IMMEDIATE MEDICAL CARE IF: You have unusual pain at the radial site.  You have redness, warmth, swelling, or pain at the radial site.  You have drainage (other than a small amount of blood on the dressing).  You have chills.  You have a fever or persistent symptoms for more than 72 hours.  You have a fever and your symptoms suddenly get worse.  Your arm becomes pale, cool, tingly, or numb.  You have heavy bleeding from the site. Hold pressure on the site.   PLEASE DO NOT MISS ANY DOSES OF YOUR BRILINTA!!!!! Also keep a log of you blood pressures and bring back to your follow up appt. Please call the office with any questions.   Patients taking blood thinners should generally stay away from medicines like ibuprofen, Advil, Motrin, naproxen, and Aleve due to risk of stomach bleeding. You may take Tylenol as directed or talk to your primary doctor about alternatives.   PLEASE ENSURE THAT YOU DO NOT RUN OUT OF YOUR BRILINTA. This medication is very important to remain on for at least one year. IF you have issues obtaining this medication due to cost please CALL the office 3-5 business days prior to running out in order to prevent missing doses of this medication.   Increase activity slowly   Complete by: As directed        Discharge Medications   Allergies as of 11/23/2021   No Known Allergies      Medication  List     STOP taking these medications    aspirin 81 MG chewable tablet Replaced by: aspirin EC 81 MG tablet   clopidogrel 75 MG tablet Commonly known as: PLAVIX   metoprolol tartrate 25 MG tablet Commonly known as: LOPRESSOR   OVER THE COUNTER MEDICATION       TAKE these medications    acetaminophen 500 MG tablet Commonly known as: TYLENOL Take 1,000 mg by mouth every 6 (six) hours as needed for mild pain.   aspirin EC 81 MG tablet Take 1 tablet (81 mg total) by mouth daily. Swallow  whole. Start taking on: Nov 24, 2021 Replaces: aspirin 81 MG chewable tablet   atorvastatin 80 MG tablet Commonly known as: LIPITOR Take 1 tablet (80 mg total) by mouth daily. Start taking on: Nov 24, 2021   empagliflozin 10 MG Tabs tablet Commonly known as: JARDIANCE Take 1 tablet (10 mg total) by mouth daily. Start taking on: Nov 24, 2021   losartan 25 MG tablet Commonly known as: COZAAR Take 1 tablet (25 mg total) by mouth at bedtime. What changed: when to take this   metoprolol succinate 25 MG 24 hr tablet Commonly known as: TOPROL-XL Take 1.5 tablets (37.5 mg total) by mouth at bedtime.   nitroGLYCERIN 0.4 MG SL tablet Commonly known as: Nitrostat Place 1 tablet (0.4 mg total) under the tongue every 5 (five) minutes as needed.   ticagrelor 90 MG Tabs tablet Commonly known as: BRILINTA Take 1 tablet (90 mg total) by mouth 2 (two) times daily.               Durable Medical Equipment  (From admission, onward)           Start     Ordered   11/22/21 1445  For home use only DME 3 n 1  Once        11/22/21 1445   11/22/21 1445  For home use only DME Walker rolling  Once       Question Answer Comment  Walker: With 5 Inch Wheels   Patient needs a walker to treat with the following condition STEMI (ST elevation myocardial infarction) ([place])      11/22/21 1445              Outstanding Labs/Studies   FLP/LFTs in 8 weeks  BMET at follow up  Duration of Discharge Encounter   Greater than 30 minutes including physician time.  Signed, Reino Bellis, NP 11/23/2021, 11:24 AM   ATTENDING ATTESTATION:  After conducting a review of all available clinical information with the care team, interviewing the patient, and performing a physical exam, I agree with the findings and plan described in this note.   GEN: No acute distress.   HEENT:  MMM, no JVD, no scleral icterus Cardiac: RRR, no murmurs, rubs, or gallops.  Respiratory: Clear to auscultation  bilaterally. GI: Soft, nontender, non-distended  MS: No edema; No deformity. Neuro:  Nonfocal  Vasc:  +2 radial pulses  Patient doing well after admission for ventricular fib tori arrest due to acute occlusion of obtuse marginal due to very late stent thrombosis.  Continue Toprol, losartan, Jardiance, and dual antiplatelet therapy for treatment of ST elevation myocardial infarction and mild cardiomyopathy.  Discharge today with cardiology follow-up and cardiac rehabilitation referral.  Lenna Sciara, MD Pager [phone]

## 2021-11-23 NOTE — TOC Benefit Eligibility Note (Signed)
Patient Teacher, English as a foreign language completed.    The patient is currently admitted and upon discharge could be taking Brilinta 90 mg.  The current 30 day co-pay is, $140.00 due to a $100.00 deductible.  Once deductible is met will be a $40.00 copay.   The patient is currently admitted and upon discharge could be taking Jardiance 10 mg.  The current 30 day co-pay is, $140.00 due to a $100.00 deductible.  Once deductible is met will be a $40.00 copay.   The patient is insured through [employer] [place] Patient [place] Patient Advocate Team Direct Number: [phone]  Fax: [phone]

## 2021-11-23 NOTE — Telephone Encounter (Signed)
**Note De-identified Hayden Ramirez Obfuscation** -----  **Note De-Identified Hayden Ramirez Obfuscation** Message from Arty Baumgartner, NP sent at 11/23/2021 11:20 AM EDT ----- Regarding: TOC call Needs TOC call please

## 2021-11-23 NOTE — TOC Transition Note (Signed)
Transition of Care [facility]) - CM/SW Discharge Note   Patient Details  Name: Hayden Ramirez MRN: [id number] Date of Birth: [birth year]  [facility] CM/SW Contact:  Lockie Pares, RN Phone Number: 11/23/2021, 12:15 PM   Clinical Narrative:     Spoke to patient regarding walker. He will keep the walker that was delivered in case needed. Information on patient DC instructions.  Final next level of care: Home w Home Health Services Barriers to Discharge: Continued Medical Work up   Patient Goals and CMS Choice     Choice offered to / list presented to : Patient  Discharge Placement               Home with Home health        Discharge Plan and Services In-house Referral: NA Discharge Planning Services: CM Consult Post Acute Care Choice: Home Health, Durable Medical Equipment          DME Arranged: 3-N-1, Walker rolling   Date DME Agency Contacted: 11/22/21 Time DME Agency Contacted: [phone] Representative spoke with at DME Agency: [facility] HH Arranged: PT, OT HH Agency: [facility] Date [facility] Agency Contacted: 11/22/21 Time HH Agency Contacted: 1455 Representative spoke with at [facility] Agency: Arline Asp  Social Determinants of Health (SDOH) Interventions     Readmission Risk Interventions     View : No data to display.

## 2021-11-23 NOTE — Progress Notes (Signed)
CARDIAC REHAB PHASE I   PRE:  Rate/Rhythm: 98 NSR  BP:  Sitting: 133/96       SaO2: 92 RA  MODE:  Ambulation: 380 ft   POST:  Rate/Rhythm: 106 ST  BP:  Sitting: 112/81      SaO2: 94   Seen pt from 0926-1025, pt was ambulated through hallways with RW and minimal assistance. Pt walked and talked during ambulation,pt denies chest pain but had SOB which required breaks to catch breath. Pt had low BP but denies dizziness. Pt received MI booklet walkthrough, restrictions, Birlinta and Aspirin use, ex guidelines, diet, smoking cessation, NTG use, and orientation to [facility]. Pt will be referred to [facility] in [place].  Faustino Congress  10:15 AM 11/23/2021

## 2021-11-23 NOTE — Evaluation (Signed)
Occupational Therapy Evaluation Patient Details Name: Hayden Ramirez MRN: [id number] DOB: 11/04/1965 Today's Date: 11/23/2021   History of Present Illness The pt is a 56 yo male presenting 5/27 became unresponsive shortly after arrival to ED triage. Pt underwent 8 min CPR and was shocked x2 prior to ROSC. Pt intubated after ROSC, EKG showing STEMI. Extubated 5/27. PMH includes: CAD, STEMI s/p DES to 1st marginal lesion and RCA, HTN, HLD, and tobacco use.   Clinical Impression   Pt admitted as above presenting with deficits as listed below. Pt was seen for OT assessment followed by ADL retraining session and pt education re: ADL's, tub transfers, DME (RW and 3:1) use at home for bathing. He is mod I dressing and bathing from sit to stand. He completed grooming standing at sink. Pt appears at/near baseline level but was educated verbally to have PRN family assist and rest breaks as needed (energy conservation) as his activity tolerance increases. Pt denies further needs from acute OT at this time and feels as though he has necessary family support and assist. Will sign off acute OT at this time.     Recommendations for follow up therapy are one component of a multi-disciplinary discharge planning process, led by the attending physician.  Recommendations may be updated based on patient status, additional functional criteria and insurance authorization.   Follow Up Recommendations  No OT follow up    Assistance Recommended at Discharge PRN  Patient can return home with the following A little help with bathing/dressing/bathroom;Assistance with cooking/housework;A little help with walking and/or transfers    Functional Status Assessment  Patient has had a recent decline in their functional status and demonstrates the ability to make significant improvements in function in a reasonable and predictable amount of time.  Equipment Recommendations  BSC/3in1    Recommendations for Other Services        Precautions / Restrictions Precautions Precautions: Fall Precaution Comments: watch heart rhythm with activity Restrictions Weight Bearing Restrictions: No      Mobility Bed Mobility Overal bed mobility: Modified Independent Bed Mobility: Supine to Sit     Supine to sit: Modified independent (Device/Increase time), HOB elevated     General bed mobility comments: Pt able to perform all bed mobility aspects without assistance, HOB elevated.    Transfers Overall transfer level: Needs assistance Equipment used: Rolling walker (2 wheels) Transfers: Sit to/from Stand Sit to Stand: Modified independent (Device/Increase time)           General transfer comment: No hands on assist using RW from EOB and toilet in bathroom with sit to stand transfers x3. No LOB      Balance Overall balance assessment: Mild deficits observed, not formally tested, Needs assistance Sitting-balance support: No upper extremity supported, Feet supported Sitting balance-Leahy Scale: Good     Standing balance support: No upper extremity supported, Bilateral upper extremity supported, During functional activity Standing balance-Leahy Scale: Fair       ADL either performed or assessed with clinical judgement   ADL Overall ADL's : Modified independent;Needs assistance/impaired Eating/Feeding: Modified independent;Sitting   Grooming: Wash/dry hands;Wash/dry face;Oral care;Modified independent;Standing   Upper Body Bathing: Modified independent;Sitting;Standing   Lower Body Bathing: Set up;Modified independent;Sitting/lateral leans;Sit to/from stand   Upper Body Dressing : Set up;Modified independent;Sitting   Lower Body Dressing: Modified independent;Sit to/from stand;Sitting/lateral leans Lower Body Dressing Details (indicate cue type and reason): Pt is Mod I donning socks at EOB Toilet Transfer: Modified Independent;Ambulation;Rolling walker (2 wheels)   Toileting-  Clothing  Manipulation and Hygiene: Modified independent;Sit to/from stand;Sitting/lateral lean   Tub/ Shower Transfer: Tub transfer;Supervision/safety;Min guard;Ambulation Tub/Shower Transfer Details (indicate cue type and reason): Simulated - discussed having family present and use of 3:1 for tub PRN. Pt verbalized understanding Functional mobility during ADLs: Modified independent;Rolling walker (2 wheels) General ADL Comments: Pt was seen for OT assessment and pt education re: ADL's, tub transfers, DME use at home for bathing. He is mod I dressing and bathing from sit to stand. He completed grooming standing at sink. Pt appears at baseline level but was educated verbally to have PRN family assist and rest breaks as needed (energy conservation) as his activity tolerance increases. Pt denies further needs from acute OT at this time and feels as though he has necessary family support and assist. Will sign off acute OT at this time.     Vision Ability to See in Adequate Light: 0 Adequate Patient Visual Report: No change from baseline Vision Assessment?: No apparent visual deficits            Pertinent Vitals/Pain Pain Assessment Pain Assessment: 0-10 (Pt denies pain at rest but reports chest soreness "when I get coughing") Pain Score: 8  Pain Location: Chest soreness when he coughs. Pt reports 0/10 pain at rest Pain Descriptors / Indicators: Grimacing, Sore Pain Intervention(s): Monitored during session, Limited activity within patient's tolerance, Repositioned     Hand Dominance Left   Extremity/Trunk Assessment Upper Extremity Assessment Upper Extremity Assessment: Overall WFL for tasks assessed   Lower Extremity Assessment Lower Extremity Assessment: Overall WFL for tasks assessed;Defer to PT evaluation   Cervical / Trunk Assessment Cervical / Trunk Assessment: Other exceptions Cervical / Trunk Exceptions: tenderness at chest/ribs from CPR   Communication Communication Communication:  No difficulties   Cognition Arousal/Alertness: Awake/alert Behavior During Therapy: WFL for tasks assessed/performed Overall Cognitive Status: Within Functional Limits for tasks assessed     General Comments: pt calm, answering questions appropriately, able to follow all instructions     General Comments  SpO2 with consistent wave forms during ADL's and functional transfers. HR up to 104 with no alarming noted on monitor throughout session. Pt was noted to have bloody nose after blowing his nose during ADL session. This had stopped at conclusion of therapy session - NT was made aware/notified.     Home Living Family/patient expects to be discharged to:: Private residence Living Arrangements: Children;Parent Available Help at Discharge: Family;Available 24 hours/day Type of Home: House Home Access: Stairs to enter [employer] of Steps: 1-2 Entrance Stairs-Rails: None Home Layout: One level     Bathroom Shower/Tub: Chief Strategy Officer: Standard Bathroom Accessibility: Yes How Accessible: Accessible via walker Home Equipment: None      Prior Functioning/Environment Prior Level of Function : Independent/Modified Independent;Working/employed;Driving     Mobility Comments: pt reports independent with all mobility, 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 very active with work ADLs Comments: independent        OT Problem List: Decreased activity tolerance;Decreased knowledge of use of DME or AE      OT Treatment/Interventions:      OT Goals(Current goals can be found in the care plan section) Acute Rehab OT Goals Patient Stated Goal: Get better and go home with family assist as needed OT Goal Formulation: All assessment and education complete, DC therapy Time For Goal Achievement: 11/23/21 Potential to Achieve Goals: Good  OT Frequency:  AM-PAC OT "6 Clicks" Daily Activity     Outcome Measure Help from another  person eating meals?: None Help from another person taking care of personal grooming?: None Help from another person toileting, which includes using toliet, bedpan, or urinal?: A Little Help from another person bathing (including washing, rinsing, drying)?: A Little Help from another person to put on and taking off regular upper body clothing?: None Help from another person to put on and taking off regular lower body clothing?: None 6 Click Score: 22   End of Session Equipment Utilized During Treatment: Rolling walker (2 wheels) Nurse Communication: Mobility status;Other (comment) (Bloody nose during ADL session after blowing his nose. This had ceased but NT was made aware.)  Activity Tolerance: Patient tolerated treatment well Patient left: in chair;with call bell/phone within reach                   Time: 0812-0852 OT Time Calculation (min): 40 min Charges:  OT General Charges $OT Visit: 1 Visit OT Evaluation $OT Eval Moderate Complexity: 1 Mod OT Treatments $Self Care/Home Management : 8-22 mins  Gyan Cambre Beth Dixon, OTR/L 11/23/2021, 9:12 AM

## 2021-11-23 NOTE — Progress Notes (Signed)
Progress Note  Patient Name: Hayden Ramirez Date of Encounter: 11/23/2021  [facility] Cardiologist: None   Subjective   Transferred out of 2H yesterday.  No acute events.  Feels well aside from chest soreness.  Inpatient Medications    Scheduled Meds:  aspirin EC  81 mg Oral Daily   atorvastatin  80 mg Oral Daily   Chlorhexidine Gluconate Cloth  6 each Topical Daily   docusate sodium  100 mg Oral BID   empagliflozin  10 mg Oral Daily   losartan  25 mg Oral QHS   mouth rinse  15 mL Mouth Rinse BID   metoprolol succinate  25 mg Oral QHS   pantoprazole  40 mg Oral Daily   pneumococcal 20-valent conjugate vaccine  0.5 mL Intramuscular Tomorrow-1000   polyethylene glycol  17 g Per Tube Daily   sodium chloride flush  3 mL Intravenous Q12H   ticagrelor  90 mg Oral BID   Continuous Infusions:  sodium chloride     sodium chloride     PRN Meds: sodium chloride, acetaminophen **OR** acetaminophen (TYLENOL) oral liquid 160 mg/5 mL **OR** acetaminophen, ondansetron (ZOFRAN) IV, polyvinyl alcohol, sodium chloride flush   Vital Signs    Vitals:   11/23/21 0437 11/23/21 0818 11/23/21 0854 11/23/21 0902  BP: 123/73  117/74   Pulse: 79  98 88  Resp: 13  19 19   Temp: 98.9 F (37.2 C)  99 F (37.2 C)   TempSrc: Oral  Oral   SpO2: 98% 98% 100% 98%  Weight:      Height:        Intake/Output Summary (Last 24 hours) at 11/23/2021 0939 Last data filed at 11/23/2021 0439 Gross per 24 hour  Intake 466.28 ml  Output 925 ml  Net -458.72 ml      11/22/2021   11:09 AM 11/21/2021    4:00 AM 11/20/2021    6:00 AM  Last 3 Weights  Weight (lbs) 175 lb 170 lb 13.7 oz 170 lb 3.1 oz  Weight (kg) 79.379 kg 77.5 kg 77.2 kg      Telemetry    NSR - Personally Reviewed  ECG    No new tracing today - Personally Reviewed  Physical Exam   GEN: Awake, alert, comfortable; scleral hemorrhages Neck: No JVD Cardiac: RRR, no murmurs Respiratory: Rhonchorous GI: Soft,  nontender, non-distended  MS: No edema; No deformity. Right radial access site c/d/i Neuro:  Nonfocal  Psych: Normal affect   Labs    High Sensitivity Troponin:   Recent Labs  Lab 11/20/21 0225 11/20/21 0552  TROPONINIHS 29* 13,662*     Chemistry Recent Labs  Lab 11/20/21 0225 11/20/21 0231 11/21/21 0200 11/22/21 0112 11/23/21 0237  NA 140   < > 138 130* 130*  K 2.7*   < > 3.9 4.0 3.5  CL 108   < > 106 102 101  CO2 <7*   < > 24 17* 20*  GLUCOSE 287*   < > 99 113* 92  BUN 16   < > 18 17 17   CREATININE 1.62*   < > 1.19 1.10 1.10  CALCIUM 7.3*   < > 8.4* 9.0 8.4*  MG 2.1  --  1.5* 1.9 2.0  PROT 4.3*  --   --   --   --   ALBUMIN 2.5*  --   --   --   --   AST 170*  --   --   --   --  ALT 134*  --   --   --   --   ALKPHOS 42  --   --   --   --   BILITOT 0.2*  --   --   --   --   GFRNONAA 50*   < > >60 >60 >60  ANIONGAP NOT CALCULATED   < > 8 11 9    < > = values in this interval not displayed.    Lipids  Recent Labs  Lab 11/20/21 0225  CHOL 114  TRIG 96  HDL 28*  LDLCALC 67  CHOLHDL 4.1    Hematology Recent Labs  Lab 11/21/21 0342 11/22/21 0112 11/23/21 0237  WBC 11.8* 5.5 6.8  RBC 4.47 5.05 4.84  HGB 14.0 15.8 14.9  HCT 39.7 44.0 41.6  MCV 88.8 87.1 86.0  MCH 31.3 31.3 30.8  MCHC 35.3 35.9 35.8  RDW 14.0 14.0 14.0  PLT 225 206 200   Thyroid No results for input(s): TSH, FREET4 in the last 168 hours.  BNPNo results for input(s): BNP, PROBNP in the last 168 hours.  DDimer No results for input(s): DDIMER in the last 168 hours.   Radiology    ECHOCARDIOGRAM LIMITED  Result Date: 11/21/2021  EF 45-50% with posterolateral HK     Cardiac Studies   LHC 11/20/21:   Mid RCA lesion is 15% stenosed.   1st Mrg lesion is 100% stenosed.   A stent was successfully placed.   Post intervention, there is a 0% residual stenosis.   LV end diastolic pressure is severely elevated.   1.  Very late stent thrombosis of prior obtuse marginal stent treated with 1  drug-eluting stent. 2.  Patent mid right coronary artery stent with mild in-stent restenosis with mild diffuse disease elsewhere. 3.  LVEDP of 30 mmHg; the patient received 40 mg of Lasix IV x1 in the cardiac catheterization laboratory 4.  Delay in care due to need to obtain CT scan to rule out intracranial hemorrhage prior to heparinization.   Recommendation: Transferred to ICU for further evaluation and treatment; continue cangrelor for now and with planned load of DAPT later this admission.    Patient Profile     56 y.o. male with history of CAD (had STEMI in 2017  100% stenosis of 1st Mrg lesion (DES successfully placed with a 0% residual stenosis) and 90% stenosis of the Mid RCA (DES placed as well with 0% residual stenosis), HTN, HLD, and tobacco abuse who presented to the ER with acute STEMI with course complicated by VF arrest on arrival s/p CPR with ROSC and cath with very late stent thrombosis of OM1 s/p DES. Now improved and extubated.   Assessment & Plan    #STEMI: #Known CAD with history of prior STEMI #VF Arrest: Patient with known history of CAD with prior STEMI in 2017 s/p DES to OM1 (culprit lesion) and RCA who presented with acute inferolateral STEMI with course complicated by VF arrest on arrival. Cont DAPT, high dose atorvastatin, Toprol XL (increase to 37.5mg ).  D/C today with follow up and rehab.   #Cardimyopathy; EF 45-50% with posterolateral HK -Cont Toprol XL (increase to 37.5), Jardiance 10mg , and losartan 25mg   #VT -Improved, continue BB.  #HLD: -Continue lipitor 80mg  daily -Continue zetia 10mg  daily -Goal LDL<55 -Needs repeat lipids in 6-8weeks  #HTN: -Cont Toprol and start losartan as above  #Tobacco Abuse: -Patient is motivated to quit tobacco now      For questions or updates, please contact [facility] Please consult [email or web address] for contact info under        Signed, Orbie Pyo, MD  11/23/2021, 9:39 AM   Patient ID: Hayden Ramirez, male   DOB: [birth year], 56 y.o.   MRN: [id number]

## 2021-11-24 NOTE — Telephone Encounter (Deleted)
**Note De-Identified Hayden Ramirez Obfuscation** Patients mother and DPR, Hayden Ramirez contacted regarding the pts discharge from [facility] on 11/23/2021 (the pt joined the conversation as well).  The patient and [name] understands that the pt has a follow up scheduled with provider Eligha Bridegroom, NP on 12/01/2021 at 10:55 at [address] in [place], [place] [address].  Patient understands his discharge instructions? Yes Patient understands medications and regiment? Yes Patient understands to bring all medications to this visit? Yes  Ask patient:  Are you enrolled in My Chart: The pt states that he plans to sign up today and does not require asst.  The pt states that he has been doing well since returning home from the hospital and denies having any CP/discomfort, SOB, nausea, diaphoresis, dizziness, lightheadedness, and that his Cath site is without redness,

## 2021-11-24 NOTE — Telephone Encounter (Signed)
**Note De-Identified Hayden Ramirez Obfuscation** The patients mother and DPR, [name] answered this call regarding the pts discharge from [facility] on 11/23/2021 (the pt joined the conversation as well).   The patient and [name] understands that the pt has a follow up scheduled with provider Eligha Bridegroom, NP on 12/01/2021 at 10:55 at [address] in [place], [place] [address].   Patient understands his discharge instructions? Yes Patient understands medications and regiment? Yes Patient understands to bring all medications to this visit? Yes   Ask patient:  Are you enrolled in My Chart: The pt states that he plans to sign up today and does not require asst.   The pt states that he has been doing well since returning home from the hospital and denies having any CP/discomfort, SOB, nausea, diaphoresis, dizziness, lightheadedness, and reports that his Cath site is without redness, streaking, fever,or drainage.  They verified that they do have [facility]'s phone number to call if they have any questions or concerns.  They both thanked me for my call.

## 2021-11-26 ENCOUNTER — Other Ambulatory Visit (HOSPITAL_COMMUNITY): Payer: Self-pay

## 2021-11-30 NOTE — Progress Notes (Unsigned)
Cardiology Office Note:    Date:  12/01/2021   ID:  Hayden Ramirez, DOB [birth year], MRN [id number]  PCP:  Kathyrn Lass   [facility] Providers Cardiologist:  Early Osmond, MD     Referring MD: No ref. provider found   Chief Complaint: hospital follow-up CAD s/p STEMI with VF arrest  History of Present Illness:    Hayden Ramirez is a pleasant 56 y.o. male with a hx of hypertension, hyperlipidemia, prior STEMI 2017 with DES to 1st marginal and DES to mid RCA, tobacco abuse, ICM   Prior stent 2017. He reports he lost his insurance and was lost in follow-up until 10/2021.   He presented to ED on 11/20/2021 for back tightness. While in triage he became unresponsive and was noted to be in cardiac arrest.  Monitor revealed V-fib and ACLS was initiated with 8 minutes of CPR. ROSC was obtained.  He was taken emergently to the Cath Lab which revealed very late stent thrombosis of prior obtuse marginal stent treated with 1 DES, patent mid right coronary artery stent with mild in-stent restenosis with mild diffuse elsewhere.  LVEDP of 30 mmHg.  Today, he is here alone for follow-up.  He reports no chest pain with the exception of mild chest discomfort when coughing up phlegm.  He denies dyspnea.  He is returning to regular activities without significant fatigue.  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  Has smoked 1-1/2 cigars since hospital discharge, a decrease from 5 cigars a day previously.  He lower extremity edema, fatigue, palpitations, melena, hematuria, hemoptysis, diaphoresis, weakness, presyncope, syncope, orthopnea, and PND.  Past Medical History:  Diagnosis Date   CAD (coronary artery disease)    a. 01/2016: STEMI w/ 100% stenosis 1st Mrg (DES placed) and 90% stenosis mid-RCA (DES placed).    HLD (hyperlipidemia)    Tobacco use     Past Surgical History:  Procedure Laterality Date   CARDIAC CATHETERIZATION N/A 01/30/2016   Procedure: Left Heart Cath and  Coronary Angiography;  Surgeon: Jettie Booze, MD;  Location: [place] [facility];  Service: Cardiovascular;  Laterality: N/A;   CARDIAC CATHETERIZATION N/A 01/30/2016   Procedure: Coronary Stent Intervention;  Surgeon: Jettie Booze, MD;  Location: [place] [facility];  Service: Cardiovascular;  Laterality: N/A;   CORONARY STENT INTERVENTION N/A 11/20/2021   Procedure: CORONARY STENT INTERVENTION;  Surgeon: Early Osmond, MD;  Location: [place] [facility];  Service: Cardiovascular;  Laterality: N/A;   LEFT HEART CATH AND CORONARY ANGIOGRAPHY N/A 11/20/2021   Procedure: LEFT HEART CATH AND CORONARY ANGIOGRAPHY;  Surgeon: Early Osmond, MD;  Location: [place] [facility];  Service: Cardiovascular;  Laterality: N/A;    Current Medications: Current Meds  Medication Sig   acetaminophen (TYLENOL) 500 MG tablet Take 1,000 mg by mouth every 6 (six) hours as needed for mild pain.   aspirin EC 81 MG tablet Take 1 tablet (81 mg total) by mouth daily. Swallow whole.   atorvastatin (LIPITOR) 80 MG tablet Take 1 tablet (80 mg total) by mouth daily.   empagliflozin (JARDIANCE) 10 MG TABS tablet Take 1 tablet (10 mg total) by mouth daily.   losartan (COZAAR) 25 MG tablet Take 1 tablet (25 mg total) by mouth at bedtime.   metoprolol succinate (TOPROL-XL) 25 MG 24 hr tablet Take 1.5 tablets (37.5 mg total) by mouth at bedtime.   nitroGLYCERIN (NITROSTAT) 0.4 MG SL tablet Place 1 tablet (0.4  mg total) under the tongue every 5 (five) minutes as needed.   ticagrelor (BRILINTA) 90 MG TABS tablet Take 1 tablet (90 mg total) by mouth 2 (two) times daily.     Allergies:   Patient has no known allergies.   Social History   Socioeconomic History   Marital status: Single    Spouse name: Not on file   Number of children: Not on file   Years of education: Not on file   Highest education level: Not on file  Occupational History   Not on file  Tobacco Use   Smoking status: Light Smoker    Packs/day:  1.00    Types: Cigarettes   Smokeless tobacco: Never   Tobacco comments:    Smokes Black and Milds  Vaping Use   Vaping Use: Never used  Substance and Sexual Activity   Alcohol use: Yes    Comment: rare   Drug use: Yes    Types: Marijuana   Sexual activity: Not on file  Other Topics Concern   Not on file  Social History Narrative   Not on file   Social Determinants of Health   Financial Resource Strain: Not on file  Food Insecurity: Not on file  Transportation Needs: Not on file  Physical Activity: Not on file  Stress: Not on file  Social Connections: Not on file     Family History: The patient's family history includes Heart attack in his paternal grandfather.  ROS:   Please see the history of present illness.  All other systems reviewed and are negative.  Labs/Other Studies Reviewed:    The following studies were reviewed today:  Echo 11/16/21   1. Posterior lateral wall hypokinesis . Left ventricular ejection  fraction, by estimation, is 45 to 50%. The left ventricle has mildly  decreased function. The left ventricle demonstrates regional wall motion  abnormalities (see scoring diagram/findings  for description).   2. Right ventricular systolic function is normal. The right ventricular  size is normal.   3. Left atrial size was mildly dilated.   4. The mitral valve is normal in structure. No evidence of mitral valve  regurgitation. No evidence of mitral stenosis.   5. The aortic valve is tricuspid. There is mild calcification of the  aortic valve. Aortic valve regurgitation is not visualized. Aortic valve  sclerosis is present, with no evidence of aortic valve stenosis.   6. The inferior vena cava is normal in size with greater than 50%  respiratory variability, suggesting right atrial pressure of 3 mmHg.    LHC 11/20/21    Mid RCA lesion is 15% stenosed.   1st Mrg lesion is 100% stenosed.   A stent was successfully placed.   Post intervention, there is  a 0% residual stenosis.   LV end diastolic pressure is severely elevated.   1.  Very late stent thrombosis of prior obtuse marginal stent treated with 1 drug-eluting stent. 2.  Patent mid right coronary artery stent with mild in-stent restenosis with mild diffuse disease elsewhere. 3.  LVEDP of 30 mmHg; the patient received 40 mg of Lasix IV x1 in the cardiac catheterization laboratory 4.  Delay in care due to need to obtain CT scan to rule out intracranial hemorrhage prior to heparinization.   Recommendation: Transferred to ICU for further evaluation and treatment; continue cangrelor for now and with planned load of DAPT later this admission. Diagnostic Dominance: Right Intervention         Recent Labs: 11/20/2021: ALT  134 11/23/2021: BUN 17; Creatinine, Ser 1.10; Hemoglobin 14.9; Magnesium 2.0; Platelets 200; Potassium 3.5; Sodium 130  Recent Lipid Panel    Component Value Date/Time   CHOL 114 11/20/2021 0225   TRIG 96 11/20/2021 0225   HDL 28 (L) 11/20/2021 0225   CHOLHDL 4.1 11/20/2021 0225   VLDL 19 11/20/2021 0225   LDLCALC 67 11/20/2021 0225     Risk Assessment/Calculations:         Physical Exam:    VS:  BP 112/72   Pulse 75   Ht 5\' 11"  (1.803 m)   Wt 161 lb (73 kg)   SpO2 99%   BMI 22.45 kg/m     Wt Readings from Last 3 Encounters:  12/01/21 161 lb (73 kg)  11/22/21 175 lb (79.4 kg)  03/28/16 174 lb 9.6 oz (79.2 kg)     GEN:  Well nourished, well developed in no acute distress HEENT: Normal. Bilateral conjunctival hemorrhages NECK: No JVD; No carotid bruits CARDIAC: RRR, no murmurs, rubs, gallops RESPIRATORY:  Clear to auscultation without rales, wheezing or rhonchi  ABDOMEN: Soft, non-tender, non-distended MUSCULOSKELETAL:  No edema; No deformity. 2+ pedal pulses, equal bilaterally SKIN: Warm and dry NEUROLOGIC:  Alert and oriented x 3 PSYCHIATRIC:  Normal affect   EKG:  EKG is ordered today.  The ekg ordered today demonstrates NSR at 75 bpm,  TWI V4-V6, low voltage, improved from previous tracing  Diagnoses:    1. Coronary artery disease involving native coronary artery of native heart without angina pectoris   2. Dyslipidemia, goal LDL below 70   3. ST elevation myocardial infarction (STEMI) involving other coronary artery of inferior wall ([place])   4. History of sudden cardiac arrest successfully resuscitated   5. Tobacco abuse    Assessment and Plan:     CAD s/p DES to first obtuse marginal: Presented with STEMI 5/27, cardiac catheterization revealed late stent thrombosis of prior OM stent treated with PCI/DES x1.  Since mid RCA stent with mild ISR and other diffuse disease to be treated medically.  He reports no problems with aspirin or Brilinta. No bleeding concerns. No change to medical therapy today.  He would like to participate in cardiac rehab, however work schedule may interfere.  I have asked him to check with cardiac rehab for time that will accommodate his work schedule.  I have written him out of work for now as I feel cardiac rehab is imperative to his recovery.   S/p VF cardiac arrest:  He collapsed in the ED and received 8 minutes of CPR with ROSC.  Treated with IV amiodarone. No further occurrence of arrhythmia on tele. Bilateral conjunctival hemorrhage since he awakened following CPR.  Concerns for presyncope, syncope since hospital discharge. Continue Toprol.  HFrEF/ICM: Initial echo read LVEF 60 to 65% but poor acoustic windows noted.  Repeat limited echo showed LVEF 45 to 50% with posterior lateral wall hypokinesis, normal RV, mildly dilated left atrium. LVEDP of 30 mmHg during cath, treated with IV Lasix with improvement.  Discharged on Jardiance, losartan, metoprolol.  He appears euvolemic on exam today.  He denies dyspnea, edema, orthopnea, or PND. Continue GDMT including losartan, Jardiance and metoprolol.  Hyperlipidemia LDL goal < 70: LDL 67. Continue atorvastatin.   Tobacco abuse: Still was smoking 5  cigars daily, has 1 1/2 since d/c. Offered nicotine patches, he declined. Advised he contact [email or web address].  Cessation advised.    Cardiac Rehabilitation Eligibility Assessment  The patient is ready to start cardiac rehabilitation  from a cardiac standpoint.     Disposition:   3 months with Dr. Ali Lowe   Medication Adjustments/Labs and Tests Ordered: Current medicines are reviewed at length with the patient today.  Concerns regarding medicines are outlined above.  Orders Placed This Encounter  Procedures   EKG 12-Lead   No orders of the defined types were placed in this encounter.   Patient Instructions  Medication Instructions:  Your physician recommends that you continue on your current medications as directed. Please refer to the Current Medication list given to you today.  *If you need a refill on your cardiac medications before your next appointment, please call your pharmacy*   Lab Work: None ordered  If you have labs (blood work) drawn today and your tests are completely normal, you will receive your results only by: [place] (if you have MyChart) OR A paper copy in the mail If you have any lab test that is abnormal or we need to change your treatment, we will call you to review the results.   Testing/Procedures: None ordered   Follow-Up: At [facility], you and your health needs are our priority.  As part of our continuing mission to provide you with exceptional heart care, we have created designated Provider Care Teams.  These Care Teams include your primary Cardiologist (physician) and Advanced Practice Providers (APPs -  Physician Assistants and Nurse Practitioners) who all work together to provide you with the care you need, when you need it.  We recommend signing up for the patient portal called "MyChart".  Sign up information is provided on this After Visit Summary.  MyChart is used to connect with patients for Virtual Visits (Telemedicine).  Patients  are able to view lab/test results, encounter notes, upcoming appointments, etc.  Non-urgent messages can be sent to your provider as well.   To learn more about what you can do with MyChart, go to [email or web address].    Your next appointment:   02/03/22 arrive at 11:05 to see Sharyn Lull back   04/06/22 arrive at 3:05 to see Dr. Ali Lowe  The format for your next appointment:   In Person  Provider:   Early Osmond, MD     Other Instructions [phone]  [phone] QUIT NOW)  Important Information About Sugar         Signed, Emmaline Life, NP  12/01/2021 2:43 PM    [place]

## 2021-12-01 ENCOUNTER — Encounter: Payer: Self-pay | Admitting: Nurse Practitioner

## 2021-12-01 ENCOUNTER — Ambulatory Visit (INDEPENDENT_AMBULATORY_CARE_PROVIDER_SITE_OTHER): Payer: BC Managed Care – PPO | Admitting: Nurse Practitioner

## 2021-12-01 VITALS — BP 112/72 | HR 75 | Ht 71.0 in | Wt 161.0 lb

## 2021-12-01 DIAGNOSIS — I2119 ST elevation (STEMI) myocardial infarction involving other coronary artery of inferior wall: Secondary | ICD-10-CM | POA: Diagnosis not present

## 2021-12-01 DIAGNOSIS — Z720 Tobacco use: Secondary | ICD-10-CM

## 2021-12-01 DIAGNOSIS — Z8674 Personal history of sudden cardiac arrest: Secondary | ICD-10-CM | POA: Diagnosis not present

## 2021-12-01 DIAGNOSIS — E785 Hyperlipidemia, unspecified: Secondary | ICD-10-CM

## 2021-12-01 DIAGNOSIS — I2510 Atherosclerotic heart disease of native coronary artery without angina pectoris: Secondary | ICD-10-CM

## 2021-12-01 NOTE — Patient Instructions (Addendum)
Medication Instructions:  Your physician recommends that you continue on your current medications as directed. Please refer to the Current Medication list given to you today.  *If you need a refill on your cardiac medications before your next appointment, please call your pharmacy*   Lab Work: None ordered  If you have labs (blood work) drawn today and your tests are completely normal, you will receive your results only by: [place] (if you have MyChart) OR A paper copy in the mail If you have any lab test that is abnormal or we need to change your treatment, we will call you to review the results.   Testing/Procedures: None ordered   Follow-Up: At [facility], you and your health needs are our priority.  As part of our continuing mission to provide you with exceptional heart care, we have created designated Provider Care Teams.  These Care Teams include your primary Cardiologist (physician) and Advanced Practice Providers (APPs -  Physician Assistants and Nurse Practitioners) who all work together to provide you with the care you need, when you need it.  We recommend signing up for the patient portal called "MyChart".  Sign up information is provided on this After Visit Summary.  MyChart is used to connect with patients for Virtual Visits (Telemedicine).  Patients are able to view lab/test results, encounter notes, upcoming appointments, etc.  Non-urgent messages can be sent to your provider as well.   To learn more about what you can do with MyChart, go to [email or web address].    Your next appointment:   02/03/22 arrive at 11:05 to see Sharyn Lull back   04/06/22 arrive at 3:05 to see Dr. Ali Lowe  The format for your next appointment:   In Person  Provider:   Early Osmond, MD     Other Instructions [phone]  [phone] QUIT NOW)  Important Information About Sugar

## 2021-12-02 ENCOUNTER — Telehealth (HOSPITAL_COMMUNITY): Payer: Self-pay

## 2021-12-02 NOTE — Telephone Encounter (Signed)
Attempted to call patient in regards to Cardiac Rehab - unable to leave VM, VM box full  

## 2021-12-09 ENCOUNTER — Telehealth: Payer: Self-pay | Admitting: Internal Medicine

## 2021-12-09 NOTE — Telephone Encounter (Signed)
Pt sent Disability forms through MyChart, nurse informed pt that he will need to bring in payment before the process is started.

## 2021-12-10 ENCOUNTER — Telehealth: Payer: Self-pay | Admitting: Internal Medicine

## 2021-12-10 ENCOUNTER — Telehealth (HOSPITAL_COMMUNITY): Payer: Self-pay

## 2021-12-10 DIAGNOSIS — Z0279 Encounter for issue of other medical certificate: Secondary | ICD-10-CM

## 2021-12-10 NOTE — Telephone Encounter (Signed)
[employer] Paperwork and payment received from patient. Form placed in [employer].

## 2021-12-10 NOTE — Telephone Encounter (Signed)
Transitions of Care Pharmacy   Call attempted for a pharmacy transitions of care follow-up. Unable to leave voicemail.  Call attempt #1. Will follow-up in 2-3 days.    

## 2021-12-15 ENCOUNTER — Telehealth (HOSPITAL_COMMUNITY): Payer: Self-pay | Admitting: Pharmacist

## 2021-12-15 ENCOUNTER — Other Ambulatory Visit (HOSPITAL_COMMUNITY): Payer: Self-pay

## 2021-12-15 NOTE — Telephone Encounter (Signed)
Pharmacy Transitions of Care Follow-up Telephone Call  Date of discharge: 10/3021  Discharge Diagnosis: STEMI  How have you been since you were released from the hospital? Doing ok   Medication changes made at discharge: START taking: Aspirin Low Dose (aspirin EC)  atorvastatin (LIPITOR)  Brilinta (ticagrelor)  Jardiance (empagliflozin)  metoprolol succinate (TOPROL-XL)  nitroGLYCERIN (Nitrostat)      CHANGE how you take: losartan (COZAAR)      STOP taking: aspirin 81 MG chewable tablet  Replaced by a similar medication. clopidogrel 75 MG tablet (PLAVIX)  metoprolol tartrate 25 MG tablet (LOPRESSOR)   Medication changes verified by the patient? Yes    Medication Accessibility:  Home Pharmacy: [facility]  Was the patient provided with refills on discharged medications? Yes   Have all prescriptions been transferred from [facility] to home pharmacy?  Yes  Is the patient able to afford medications? Yes Notable copays: $0/90 day supply    Medication Review: TICAGRELOR (BRILINTA) Ticagrelor 90 mg BID and ECASA 81mg  for at least a year per MD's note - Discussed importance of taking medication around the same time every day, - Reviewed potential DDIs with patient - Advised patient of medications to avoid (NSAIDs, aspirin maintenance doses>100 mg daily) - Educated that Tylenol (acetaminophen) will be the preferred analgesic to prevent risk of bleeding  - Emphasized importance of monitoring for signs and symptoms of bleeding (abnormal bruising, prolonged bleeding, nose bleeds, bleeding from gums, discolored urine, black tarry stools)  - Educated patient to notify doctor if shortness of breath or abnormal heartbeat occur - Advised patient to alert all providers of antiplatelet therapy prior to starting a new medication or having a procedure    Follow-up Appointments:  Per pt, he had seen Swinyer, M on 6/7 and another appointment on 02/03/22 @11 :[facility] f/u appt confirmed?  Scheduled to see , A on 04/06/22 @ 3:20pm.   If their condition worsens, is the pt aware to call PCP or go to the Emergency Dept.? Yes  Final Patient Assessment:   -Pt is doing ok -Pt verbalized understanding of Brilinta.  -Pt had post discharge appointment and refill sent to [facility] .  06/06/22, PharmD

## 2021-12-15 NOTE — Telephone Encounter (Signed)
Forms have been completed and patient notified

## 2022-01-10 ENCOUNTER — Encounter (HOSPITAL_COMMUNITY): Payer: Self-pay

## 2022-01-10 ENCOUNTER — Telehealth (HOSPITAL_COMMUNITY): Payer: Self-pay

## 2022-01-10 NOTE — Telephone Encounter (Signed)
Attempted to call patient in regards to Cardiac Rehab - LM on VM Mailed letter 

## 2022-01-28 NOTE — Progress Notes (Signed)
Cardiology Office Note:    Date:  02/03/2022   ID:  Hayden Pinksarrell J Manganaro, DOB 03/02/1966, MRN [id number]  PCP:  Aviva KluverPcp, No   [facility] Providers Cardiologist:  Orbie PyoArun K Thukkani, MD     Referring MD: No ref. provider found   Chief Complaint: hospital follow-up CAD s/p STEMI with VF arrest  History of Present Illness:    Hayden Ramirez is a pleasant 56 y.o. male with a hx of hypertension, hyperlipidemia, prior STEMI 2017 with DES to 1st marginal and DES to mid RCA, tobacco abuse, ICM   Prior stent 2017. He reports he lost his insurance and was lost in follow-up until 10/2021.   He presented to ED on 11/20/2021 for back tightness. While in triage he became unresponsive and was noted to be in cardiac arrest.  Monitor revealed V-fib and ACLS was initiated with 8 minutes of CPR. ROSC was obtained.  He was taken emergently to the Cath Lab which revealed very late stent thrombosis of prior obtuse marginal stent treated with 1 DES, patent mid right coronary artery stent with mild in-stent restenosis with mild diffuse elsewhere.  LVEDP of 30 mmHg.  He was last seen in our office on 12/01/21 by me. He reports no chest pain with the exception of mild chest discomfort when coughing up phlegm.  He denies dyspnea.  He is returning to regular activities without significant fatigue.  He has not returned to work as an [employer] of [employer].  Has smoked 1-1/2 cigars since hospital discharge, a decrease from 5 cigars a day previously.  He lower extremity edema, fatigue, palpitations, melena, hematuria, hemoptysis, diaphoresis, weakness, presyncope, syncope, orthopnea, and PND.  Today, he is here alone for follow-up and reports he is doing well.  He has returned to work and has not been able to participate in cardiac rehab.  No chest pain. I emphasized the importance of regular physical activity and encouraged him to try to walk at least 30 minutes daily. He recently noted mild dyspnea when walking up an incline,  no chest discomfort. States it was more of a difficulty carrying on a conversation. Encouraged physical activity of moderate intensity so that carrying on a conversation is difficult but still able to answer questions. Has not taken any nitroglycerin.  He denies orthopnea, PND, presyncope, syncope, bleeding problems, edema.   Past Medical History:  Diagnosis Date   CAD (coronary artery disease)    a. 01/2016: STEMI w/ 100% stenosis 1st Mrg (DES placed) and 90% stenosis mid-RCA (DES placed).    HLD (hyperlipidemia)    Tobacco use     Past Surgical History:  Procedure Laterality Date   CARDIAC CATHETERIZATION N/A 01/30/2016   Procedure: Left Heart Cath and Coronary Angiography;  Surgeon: Corky CraftsJayadeep S Varanasi, MD;  [facility];  Service: Cardiovascular;  Laterality: N/A;   CARDIAC CATHETERIZATION N/A 01/30/2016   Procedure: Coronary Stent Intervention;  Surgeon: Corky CraftsJayadeep S Varanasi, MD;  [facility];  Service: Cardiovascular;  Laterality: N/A;   CORONARY STENT INTERVENTION N/A 11/20/2021   Procedure: CORONARY STENT INTERVENTION;  Surgeon: Orbie Pyohukkani, Arun K, MD;  Location: [facility] INVASIVE CV LAB;  Service: Cardiovascular;  Laterality: N/A;   LEFT HEART CATH AND CORONARY ANGIOGRAPHY N/A 11/20/2021   Procedure: LEFT HEART CATH AND CORONARY ANGIOGRAPHY;  Surgeon: Orbie Pyohukkani, Arun K, MD;  Location: [facility] INVASIVE CV LAB;  Service: Cardiovascular;  Laterality: N/A;    Current Medications: Current Meds  Medication Sig   acetaminophen (TYLENOL)  500 MG tablet Take 1,000 mg by mouth every 6 (six) hours as needed for mild pain.   aspirin EC 81 MG tablet Take 1 tablet (81 mg total) by mouth daily. Swallow whole.   ticagrelor (BRILINTA) 90 MG TABS tablet Take 1 tablet (90 mg total) by mouth 2 (two) times daily.   [DISCONTINUED] atorvastatin (LIPITOR) 80 MG tablet Take 1 tablet (80 mg total) by mouth daily.   [DISCONTINUED] empagliflozin (JARDIANCE) 10 MG TABS tablet Take 1 tablet (10 mg  total) by mouth daily.   [DISCONTINUED] losartan (COZAAR) 25 MG tablet Take 1 tablet (25 mg total) by mouth at bedtime.   [DISCONTINUED] metoprolol succinate (TOPROL-XL) 25 MG 24 hr tablet Take 1.5 tablets (37.5 mg total) by mouth at bedtime.   [DISCONTINUED] nitroGLYCERIN (NITROSTAT) 0.4 MG SL tablet Place 1 tablet (0.4 mg total) under the tongue every 5 (five) minutes as needed.     Allergies:   Patient has no known allergies.   Social History   Socioeconomic History   Marital status: Single    Spouse name: Not on file   Number of children: Not on file   Years of education: Not on file   Highest education level: Not on file  Occupational History   Not on file  Tobacco Use   Smoking status: Light Smoker    Packs/day: 1.00    Types: Cigarettes   Smokeless tobacco: Never   Tobacco comments:    Smokes Black and Milds  Vaping Use   Vaping Use: Never used  Substance and Sexual Activity   Alcohol use: Yes    Comment: rare   Drug use: Yes    Types: Marijuana   Sexual activity: Not on file  Other Topics Concern   Not on file  Social History Narrative   Not on file   Social Determinants of Health   Financial Resource Strain: Not on file  Food Insecurity: Not on file  Transportation Needs: Not on file  Physical Activity: Not on file  Stress: Not on file  Social Connections: Not on file     Family History: The patient's family history includes Heart attack in his paternal grandfather.  ROS:   Please see the history of present illness.  All other systems reviewed and are negative.  Labs/Other Studies Reviewed:    The following studies were reviewed today:  Echo 11/16/21   1. Posterior lateral wall hypokinesis . Left ventricular ejection  fraction, by estimation, is 45 to 50%. The left ventricle has mildly  decreased function. The left ventricle demonstrates regional wall motion  abnormalities (see scoring diagram/findings  for description).   2. Right ventricular  systolic function is normal. The right ventricular  size is normal.   3. Left atrial size was mildly dilated.   4. The mitral valve is normal in structure. No evidence of mitral valve  regurgitation. No evidence of mitral stenosis.   5. The aortic valve is tricuspid. There is mild calcification of the  aortic valve. Aortic valve regurgitation is not visualized. Aortic valve  sclerosis is present, with no evidence of aortic valve stenosis.   6. The inferior vena cava is normal in size with greater than 50%  respiratory variability, suggesting right atrial pressure of 3 mmHg.    LHC 11/20/21    Mid RCA lesion is 15% stenosed.   1st Mrg lesion is 100% stenosed.   A stent was successfully placed.   Post intervention, there is a 0% residual stenosis.  LV end diastolic pressure is severely elevated.   1.  Very late stent thrombosis of prior obtuse marginal stent treated with 1 drug-eluting stent. 2.  Patent mid right coronary artery stent with mild in-stent restenosis with mild diffuse disease elsewhere. 3.  LVEDP of 30 mmHg; the patient received 40 mg of Lasix IV x1 in the cardiac catheterization laboratory 4.  Delay in care due to need to obtain CT scan to rule out intracranial hemorrhage prior to heparinization.   Recommendation: Transferred to ICU for further evaluation and treatment; continue cangrelor for now and with planned load of DAPT later this admission. Diagnostic Dominance: Right Intervention         Recent Labs: 11/20/2021: ALT 134 11/23/2021: BUN 17; Creatinine, Ser 1.10; Hemoglobin 14.9; Magnesium 2.0; Platelets 200; Potassium 3.5; Sodium 130  Recent Lipid Panel    Component Value Date/Time   CHOL 114 11/20/2021 0225   TRIG 96 11/20/2021 0225   HDL 28 (L) 11/20/2021 0225   CHOLHDL 4.1 11/20/2021 0225   VLDL 19 11/20/2021 0225   LDLCALC 67 11/20/2021 0225     Risk Assessment/Calculations:         Physical Exam:    VS:  BP 116/72   Pulse (!) 56   Ht 5'  11" (1.803 m)   Wt 162 lb 3.2 oz (73.6 kg)   SpO2 99%   BMI 22.62 kg/m     Wt Readings from Last 3 Encounters:  02/03/22 162 lb 3.2 oz (73.6 kg)  12/01/21 161 lb (73 kg)  11/22/21 175 lb (79.4 kg)     GEN:  Well nourished, well developed in no acute distress HEENT: Normal. Bilateral conjunctival hemorrhages NECK: No JVD; No carotid bruits CARDIAC: RRR, no murmurs, rubs, gallops RESPIRATORY:  Clear to auscultation without rales, wheezing or rhonchi  ABDOMEN: Soft, non-tender, non-distended MUSCULOSKELETAL:  No edema; No deformity. 2+ pedal pulses, equal bilaterally SKIN: Warm and dry NEUROLOGIC:  Alert and oriented x 3 PSYCHIATRIC:  Normal affect   EKG:  EKG is not ordered today.    Diagnoses:    1. Coronary artery disease involving native coronary artery of native heart without angina pectoris   2. Dyslipidemia, goal LDL below 70   3. History of sudden cardiac arrest successfully resuscitated   4. Tobacco abuse   5. History of myocardial infarction     Assessment and Plan:     CAD s/p DES to first obtuse marginal/History of MI: STEMI 11/20/21 with cardiac arrest. Cardiac cath revealed late stent thrombosis of prior OM stent treated with PCI/DES x1, mid RCA stent with mild ISR and other diffuse disease to be treated medically. He denies chest pain, dyspnea, or other symptoms concerning for angina.  No indication for further ischemic evaluation at this time. No bleeding concerns on aspirin and Brilinta. No change to medical therapy today. Was not contacted by CR until after time he had to return to work. I emphasized the importance of moderate intensity exercise to achieve 150 minutes per week and smoking cessation. He has no concerns with current medications. Will get CMET/CBC in 2 months. Continue GDMT including atorvastatin, aspirin, Brilinta, metoprolol, losartan, Jardiance.  S/p VF cardiac arrest:  Witnessed cardiac arrest in ED with 8 minutes of CPR and ROSC.  No further  episodes of VT on cardiac monitor during hospitalization. He denies presyncope or syncope.   HFrEF/ICM: Initial echo 11/20/21 read LVEF 60 to 65% with poor acoustic windows noted.  Repeat limited echo showed LVEF 45  to 50% with posterior lateral wall hypokinesis, normal RV, mildly dilated left atrium. LVEDP of 30 mmHg during cath, treated with IV Lasix with improvement. He denies dyspnea, orthopnea, PND, or edema. Appears euvolemic on exam today. Continue GDMT including losartan, Jardiance and metoprolol. Will check kidney function and electrolytes in 2 months. Encouraged low sodium, heart healthy diet, smoking cessation, and 150 minutes moderate intensity exercise each week.   Hyperlipidemia LDL goal < 70: LDL 67. We will recheck fasting lipids and lfts prior to return office visit in October. Continue atorvastatin.   Tobacco abuse: Smokes 1 pack of 5 cigars every 2 days. Admits he has not attempted to stop. Offered assistance which he declined. Complete cessation advised.    Cardiac Rehabilitation Eligibility Assessment       Disposition:   2 months with Dr. Lynnette Caffey   Medication Adjustments/Labs and Tests Ordered: Current medicines are reviewed at length with the patient today.  Concerns regarding medicines are outlined above.  Orders Placed This Encounter  Procedures   Lipid panel   Comprehensive metabolic panel   CBC   Meds ordered this encounter  Medications   metoprolol succinate (TOPROL-XL) 25 MG 24 hr tablet    Sig: Take 1.5 tablets (37.5 mg total) by mouth at bedtime.    Dispense:  135 tablet    Refill:  3   nitroGLYCERIN (NITROSTAT) 0.4 MG SL tablet    Sig: Place 1 tablet (0.4 mg total) under the tongue every 5 (five) minutes as needed.    Dispense:  25 tablet    Refill:  5   losartan (COZAAR) 25 MG tablet    Sig: Take 1 tablet (25 mg total) by mouth at bedtime.    Dispense:  90 tablet    Refill:  3   empagliflozin (JARDIANCE) 10 MG TABS tablet    Sig: Take 1 tablet  (10 mg total) by mouth daily.    Dispense:  90 tablet    Refill:  3   atorvastatin (LIPITOR) 80 MG tablet    Sig: Take 1 tablet (80 mg total) by mouth daily.    Dispense:  90 tablet    Refill:  3    Patient Instructions  Medication Instructions:  Your physician recommends that you continue on your current medications as directed. Please refer to the Current Medication list given to you today.  *If you need a refill on your cardiac medications before your next appointment, please call your pharmacy*   Lab Work: Fasting lipid, CMET and CBC on 04/04/2022. You can come anytime between 7:15 AM and 4:30 PM If you have labs (blood work) drawn today and your tests are completely normal, you will receive your results only by: MyChart Message (if you have MyChart) OR A paper copy in the mail If you have any lab test that is abnormal or we need to change your treatment, we will call you to review the results.  Follow-Up: At [facility], you and your health needs are our priority.  As part of our continuing mission to provide you with exceptional heart care, we have created designated Provider Care Teams.  These Care Teams include your primary Cardiologist (physician) and Advanced Practice Providers (APPs -  Physician Assistants and Nurse Practitioners) who all work together to provide you with the care you need, when you need it.  Your next appointment:   04/06/2022 at 3:20 PM  The format for your next appointment:   In Person  Provider:   Charlies Constable  Lynnette Caffey, MD {  Important Information About Sugar        Signed, Levi Aland, NP  02/03/2022 11:42 AM     [facility]

## 2022-02-03 ENCOUNTER — Encounter: Payer: Self-pay | Admitting: Nurse Practitioner

## 2022-02-03 ENCOUNTER — Ambulatory Visit (INDEPENDENT_AMBULATORY_CARE_PROVIDER_SITE_OTHER): Payer: BC Managed Care – PPO | Admitting: Nurse Practitioner

## 2022-02-03 VITALS — BP 116/72 | HR 56 | Ht 71.0 in | Wt 162.2 lb

## 2022-02-03 DIAGNOSIS — Z8674 Personal history of sudden cardiac arrest: Secondary | ICD-10-CM | POA: Diagnosis not present

## 2022-02-03 DIAGNOSIS — E785 Hyperlipidemia, unspecified: Secondary | ICD-10-CM

## 2022-02-03 DIAGNOSIS — I2510 Atherosclerotic heart disease of native coronary artery without angina pectoris: Secondary | ICD-10-CM | POA: Diagnosis not present

## 2022-02-03 DIAGNOSIS — Z720 Tobacco use: Secondary | ICD-10-CM | POA: Diagnosis not present

## 2022-02-03 DIAGNOSIS — I252 Old myocardial infarction: Secondary | ICD-10-CM

## 2022-02-03 MED ORDER — ATORVASTATIN CALCIUM 80 MG PO TABS: 80.0000 mg | ORAL_TABLET | Freq: Every day | ORAL | 3 refills | Status: DC

## 2022-02-03 MED ORDER — METOPROLOL SUCCINATE ER 25 MG PO TB24: 37.5000 mg | ORAL_TABLET | Freq: Every day | ORAL | 3 refills | Status: DC

## 2022-02-03 MED ORDER — LOSARTAN POTASSIUM 25 MG PO TABS: 25.0000 mg | ORAL_TABLET | Freq: Every day | ORAL | 3 refills | Status: DC

## 2022-02-03 MED ORDER — NITROGLYCERIN 0.4 MG SL SUBL
0.4000 mg | SUBLINGUAL_TABLET | SUBLINGUAL | 5 refills | Status: DC | PRN
Start: 2022-02-03 — End: 2023-02-13

## 2022-02-03 MED ORDER — EMPAGLIFLOZIN 10 MG PO TABS: 10.0000 mg | ORAL_TABLET | Freq: Every day | ORAL | 3 refills | Status: DC

## 2022-02-03 NOTE — Patient Instructions (Signed)
Medication Instructions:  Your physician recommends that you continue on your current medications as directed. Please refer to the Current Medication list given to you today.  *If you need a refill on your cardiac medications before your next appointment, please call your pharmacy*   Lab Work: Fasting lipid, CMET and CBC on 04/04/2022. You can come anytime between 7:15 AM and 4:30 PM If you have labs (blood work) drawn today and your tests are completely normal, you will receive your results only by: MyChart Message (if you have MyChart) OR A paper copy in the mail If you have any lab test that is abnormal or we need to change your treatment, we will call you to review the results.  Follow-Up: At [facility], you and your health needs are our priority.  As part of our continuing mission to provide you with exceptional heart care, we have created designated Provider Care Teams.  These Care Teams include your primary Cardiologist (physician) and Advanced Practice Providers (APPs -  Physician Assistants and Nurse Practitioners) who all work together to provide you with the care you need, when you need it.  Your next appointment:   04/06/2022 at 3:20 PM  The format for your next appointment:   In Person  Provider:   Orbie Pyo, MD {  Important Information About Sugar

## 2022-02-03 NOTE — Progress Notes (Signed)
Patient is interested however work schedule may  limit his participation. Would recommend you contact him again.

## 2022-03-14 ENCOUNTER — Telehealth (HOSPITAL_COMMUNITY): Payer: Self-pay

## 2022-03-14 NOTE — Telephone Encounter (Signed)
No response from pt regarding CR.  Closed referral.  

## 2022-03-24 ENCOUNTER — Other Ambulatory Visit (HOSPITAL_COMMUNITY): Payer: Self-pay

## 2022-03-30 NOTE — Progress Notes (Signed)
Cardiology Office Note:    Date:  04/07/2022   ID:  Hayden Ramirez, DOB Jul 26, 1965, MRN [id number]  PCP:  Hayden Ramirez   [facility] Providers Cardiologist:  Hayden Skeans, MD Referring MD: No ref. provider found   Chief Complaint/Reason for Referral: STEMI care  ASSESSMENT:    1. ST elevation myocardial infarction (STEMI) involving other coronary artery of inferior wall (HCC)   2. Hyperlipidemia LDL goal <70   3. Primary hypertension   4. Tobacco abuse   5. Ischemic cardiomyopathy   6. Coronary artery disease involving native coronary artery of native heart without angina pectoris     PLAN:    In order of problems listed above: 1.  ST elevation myocardial infarction due to very late stent thrombosis: Continue dual antiplatelet therapy.  Given his history of very late stent thrombosis I think we need to extend this to greater than 1 year.   2.  Hyperlipidemia: We will check lipid panel, LFTs, and LP(a) today. 3.  Hypertension: He has not taken his losartan today.  Continue medical therapy for now. 4.  Tobacco abuse: I encouraged him to try to quit smoking. 5.  Ischemic cardiomyopathy: Previous echocardiogram demonstrated EF of 45 to 50% with lateral hypokinesis.  Obtain echocardiogram now to evaluate for LV recovery.            Dispo:  Return in about 6 months (around 10/06/2022).      Medication Adjustments/Labs and Tests Ordered: Current medicines are reviewed at length with the patient today.  Concerns regarding medicines are outlined above.  The following changes have been made:     Labs/tests ordered: Orders Placed This Encounter  Procedures   Lipoprotein A (LPA)   Hepatic function panel   ECHOCARDIOGRAM COMPLETE    Medication Changes: No orders of the defined types were placed in this encounter.    Current medicines are reviewed at length with the patient today.  The patient does not have concerns regarding medicines.   History of Present  Illness:    FOCUSED PROBLEM LIST:   1.  Coronary artery disease status post STEMI in 2017 status post PCI of obtuse marginal; very late stent thrombosis complicated by ventricular fibrillatory arrest May 2023 status PCI of obtuse marginal 2.  Hypertension 3.  Hyperlipidemia 4.  Ongoing tobacco abuse  The patient is a 56 y.o. male with the indicated medical history here for routine follow-up.  He was seen in in June and August for follow-up after his acute myocardial infarction characterized by a late stent thrombosis of his obtuse marginal stent complicated by ventricular fibrillatory arrest.  He was unable to participate in cardiac rehabilitation due to being busy with work.  Today: Patient is doing well today without complaints.  He is back at work.  He denies any exertional angina, exertional dyspnea, presyncope, syncope, palpitations, paroxysmal atrial dyspnea, orthopnea.  He has had no severe bleeding or bruising while on dual antiplatelet therapy.  He continues to smoke but is trying to quit.  He is otherwise well without significant cardiovascular complaints today.          Current Medications: Current Meds  Medication Sig   acetaminophen (TYLENOL) 500 MG tablet Take 1,000 mg by mouth every 6 (six) hours as needed for mild pain.   aspirin EC 81 MG tablet Take 1 tablet (81 mg total) by mouth daily. Swallow whole.   atorvastatin (LIPITOR) 80 MG tablet Take 1 tablet (80 mg total) by mouth daily.  empagliflozin (JARDIANCE) 10 MG TABS tablet Take 1 tablet (10 mg total) by mouth daily.   losartan (COZAAR) 25 MG tablet Take 1 tablet (25 mg total) by mouth at bedtime.   metoprolol succinate (TOPROL-XL) 25 MG 24 hr tablet Take 1.5 tablets (37.5 mg total) by mouth at bedtime.   nitroGLYCERIN (NITROSTAT) 0.4 MG SL tablet Place 1 tablet (0.4 mg total) under the tongue every 5 (five) minutes as needed.   ticagrelor (BRILINTA) 90 MG TABS tablet Take 1 tablet (90 mg total) by mouth 2 (two) times  daily.     Allergies:    Patient has no known allergies.   Social History:   Social History   Tobacco Use   Smoking status: Light Smoker    Packs/day: 1.00    Types: Cigarettes   Smokeless tobacco: Never   Tobacco comments:    Smokes Black and Milds  Vaping Use   Vaping Use: Never used  Substance Use Topics   Alcohol use: Yes    Comment: rare   Drug use: Yes    Types: Marijuana     Family Hx: Family History  Problem Relation Age of Onset   Heart attack Paternal Grandfather      Review of Systems:   Please see the history of present illness.    All other systems reviewed and are negative.     EKGs/Labs/Other Test Reviewed:    EKG:  EKG performed June 2023 that I personally reviewed demonstrates this rhythm with inferior and lateral T wave inversions;   Prior CV studies: Echo 11/16/21  1. Posterior lateral wall hypokinesis . Left ventricular ejection  fraction, by estimation, is 45 to 50%. The left ventricle has mildly  decreased function. The left ventricle demonstrates regional wall motion  abnormalities (see scoring diagram/findings  for description).   2. Right ventricular systolic function is normal. The right ventricular  size is normal.   3. Left atrial size was mildly dilated.   4. The mitral valve is normal in structure. No evidence of mitral valve  regurgitation. No evidence of mitral stenosis.   5. The aortic valve is tricuspid. There is mild calcification of the  aortic valve. Aortic valve regurgitation is not visualized. Aortic valve  sclerosis is present, with no evidence of aortic valve stenosis.   6. The inferior vena cava is normal in size with greater than 50%  respiratory variability, suggesting right atrial pressure of 3 mmHg.      LHC 11/20/21     Mid RCA lesion is 15% stenosed.   1st Mrg lesion is 100% stenosed.   A stent was successfully placed.   Post intervention, there is a 0% residual stenosis.   LV end diastolic pressure is  severely elevated.   1.  Very late stent thrombosis of prior obtuse marginal stent treated with 1 drug-eluting stent. 2.  Patent mid right coronary artery stent with mild in-stent restenosis with mild diffuse disease elsewhere. 3.  LVEDP of 30 mmHg; the patient received 40 mg of Lasix IV x1 in the cardiac catheterization laboratory 4.  Delay in care due to need to obtain CT scan to rule out intracranial hemorrhage prior to heparinization.  Other studies Reviewed: Review of the additional studies/records demonstrates: None relevant  Recent Labs: 11/23/2021: Magnesium 2.0 04/04/2022: BUN 13; Creatinine, Ser 1.15; Hemoglobin 16.5; Platelets 289; Potassium 4.4; Sodium 137 04/06/2022: ALT 13   Recent Lipid Panel Lab Results  Component Value Date/Time   CHOL 110 04/04/2022 10:05 AM  TRIG 75 04/04/2022 10:05 AM   HDL 37 (L) 04/04/2022 10:05 AM   LDLCALC 58 04/04/2022 10:05 AM    Risk Assessment/Calculations:                Physical Exam:    VS:  BP 135/75   Pulse 60   Ht 5\' 11"  (1.803 m)   Wt 159 lb (72.1 kg)   SpO2 95%   BMI 22.18 kg/m    Wt Readings from Last 3 Encounters:  04/06/22 159 lb (72.1 kg)  02/03/22 162 lb 3.2 oz (73.6 kg)  12/01/21 161 lb (73 kg)    GENERAL:  No apparent distress, AOx3 HEENT:  No carotid bruits, +2 carotid impulses, no scleral icterus CAR: RRR no murmurs, gallops, rubs, or thrills RES:  Clear to auscultation bilaterally ABD:  Soft, nontender, nondistended, positive bowel sounds x 4 VASC:  +2 radial pulses, +2 carotid pulses, palpable pedal pulses NEURO:  CN 2-12 grossly intact; motor and sensory grossly intact PSYCH:  No active depression or anxiety EXT:  No edema, ecchymosis, or cyanosis  Signed, 01/31/22, MD  04/07/2022 8:01 AM    [facility] [address] [place], [place], [place]  [place] Phone: [phone]; Fax: [phone]   Note:  This document was prepared using Dragon voice recognition software  and may include unintentional dictation errors.

## 2022-04-04 ENCOUNTER — Ambulatory Visit: Payer: BC Managed Care – PPO | Attending: Nurse Practitioner

## 2022-04-04 DIAGNOSIS — I2510 Atherosclerotic heart disease of native coronary artery without angina pectoris: Secondary | ICD-10-CM | POA: Diagnosis not present

## 2022-04-04 DIAGNOSIS — E785 Hyperlipidemia, unspecified: Secondary | ICD-10-CM | POA: Diagnosis not present

## 2022-04-05 LAB — LIPID PANEL
Chol/HDL Ratio: 3 ratio (ref 0.0–5.0)
Cholesterol, Total: 110 mg/dL (ref 100–199)
HDL: 37 mg/dL — ABNORMAL LOW (ref >39)
LDL Chol Calc (NIH): 58 mg/dL (ref 0–99)
Triglycerides: 75 mg/dL (ref 0–149)
VLDL Cholesterol Cal: 15 mg/dL (ref 5–40)

## 2022-04-05 LAB — COMPREHENSIVE METABOLIC PANEL
ALT: 18 IU/L (ref 0–44)
AST: 18 IU/L (ref 0–40)
Albumin/Globulin Ratio: 1.6 (ref 1.2–2.2)
Albumin: 4.6 g/dL (ref 3.8–4.9)
Alkaline Phosphatase: 95 IU/L (ref 44–121)
BUN/Creatinine Ratio: 11 (ref 9–20)
BUN: 13 mg/dL (ref 6–24)
Bilirubin Total: 0.4 mg/dL (ref 0.0–1.2)
CO2: 19 mmol/L — ABNORMAL LOW (ref 20–29)
Calcium: 10 mg/dL (ref 8.7–10.2)
Chloride: 100 mmol/L (ref 96–106)
Creatinine, Ser: 1.15 mg/dL (ref 0.76–1.27)
Globulin, Total: 2.9 g/dL (ref 1.5–4.5)
Glucose: 74 mg/dL (ref 70–99)
Potassium: 4.4 mmol/L (ref 3.5–5.2)
Sodium: 137 mmol/L (ref 134–144)
Total Protein: 7.5 g/dL (ref 6.0–8.5)
eGFR: 76 mL/min/{1.73_m2} (ref >59)

## 2022-04-05 LAB — CBC
Hematocrit: 49.8 % (ref 37.5–51.0)
Hemoglobin: 16.5 g/dL (ref 13.0–17.7)
MCH: 31 pg (ref 26.6–33.0)
MCHC: 33.1 g/dL (ref 31.5–35.7)
MCV: 94 fL (ref 79–97)
Platelets: 289 10*3/uL (ref 150–450)
RBC: 5.32 x10E6/uL (ref 4.14–5.80)
RDW: 13.8 % (ref 11.6–15.4)
WBC: 8.8 10*3/uL (ref 3.4–10.8)

## 2022-04-06 ENCOUNTER — Encounter: Payer: Self-pay | Admitting: Internal Medicine

## 2022-04-06 ENCOUNTER — Ambulatory Visit: Payer: BC Managed Care – PPO | Attending: Internal Medicine | Admitting: Internal Medicine

## 2022-04-06 ENCOUNTER — Other Ambulatory Visit: Payer: BC Managed Care – PPO

## 2022-04-06 VITALS — BP 135/75 | HR 60 | Ht 71.0 in | Wt 159.0 lb

## 2022-04-06 DIAGNOSIS — E785 Hyperlipidemia, unspecified: Secondary | ICD-10-CM | POA: Diagnosis not present

## 2022-04-06 DIAGNOSIS — I2119 ST elevation (STEMI) myocardial infarction involving other coronary artery of inferior wall: Secondary | ICD-10-CM | POA: Diagnosis not present

## 2022-04-06 DIAGNOSIS — Z720 Tobacco use: Secondary | ICD-10-CM | POA: Diagnosis not present

## 2022-04-06 DIAGNOSIS — I10 Essential (primary) hypertension: Secondary | ICD-10-CM | POA: Diagnosis not present

## 2022-04-06 DIAGNOSIS — I255 Ischemic cardiomyopathy: Secondary | ICD-10-CM

## 2022-04-06 DIAGNOSIS — I2510 Atherosclerotic heart disease of native coronary artery without angina pectoris: Secondary | ICD-10-CM | POA: Diagnosis not present

## 2022-04-06 NOTE — Patient Instructions (Signed)
Medication Instructions:  Your physician recommends that you continue on your current medications as directed. Please refer to the Current Medication list given to you today.  *If you need a refill on your cardiac medications before your next appointment, please call your pharmacy*   Lab Work: Lipids, Hepatic Function, LPa If you have labs (blood work) drawn today and your tests are completely normal, you will receive your results only by: [place] (if you have MyChart) OR A paper copy in the mail If you have any lab test that is abnormal or we need to change your treatment, we will call you to review the results.   Testing/Procedures: Your physician has requested that you have an echocardiogram. Echocardiography is a painless test that uses sound waves to create images of your heart. It provides your doctor with information about the size and shape of your heart and how well your heart's chambers and valves are working. This procedure takes approximately one hour. There are no restrictions for this procedure.    Follow-Up: At [facility], you and your health needs are our priority.  As part of our continuing mission to provide you with exceptional heart care, we have created designated Provider Care Teams.  These Care Teams include your primary Cardiologist (physician) and Advanced Practice Providers (APPs -  Physician Assistants and Nurse Practitioners) who all work together to provide you with the care you need, when you need it.  We recommend signing up for the patient portal called "MyChart".  Sign up information is provided on this After Visit Summary.  MyChart is used to connect with patients for Virtual Visits (Telemedicine).  Patients are able to view lab/test results, encounter notes, upcoming appointments, etc.  Non-urgent messages can be sent to your provider as well.   To learn more about what you can do with MyChart, go to [email or web address].    Your next  appointment:   6 month(s)  The format for your next appointment:   In Person  Provider:   Early Osmond, MD

## 2022-04-07 LAB — HEPATIC FUNCTION PANEL
ALT: 13 IU/L (ref 0–44)
AST: 20 IU/L (ref 0–40)
Albumin: 4.8 g/dL (ref 3.8–4.9)
Alkaline Phosphatase: 93 IU/L (ref 44–121)
Bilirubin Total: 0.4 mg/dL (ref 0.0–1.2)
Bilirubin, Direct: <0.1 mg/dL (ref 0.00–0.40)
Total Protein: 8.1 g/dL (ref 6.0–8.5)

## 2022-04-07 LAB — LIPOPROTEIN A (LPA): Lipoprotein (a): 184.2 nmol/L — ABNORMAL HIGH (ref <75.0)

## 2022-04-18 ENCOUNTER — Ambulatory Visit (HOSPITAL_COMMUNITY): Payer: BC Managed Care – PPO | Attending: Internal Medicine

## 2022-04-18 DIAGNOSIS — I255 Ischemic cardiomyopathy: Secondary | ICD-10-CM | POA: Diagnosis not present

## 2022-04-18 DIAGNOSIS — E785 Hyperlipidemia, unspecified: Secondary | ICD-10-CM | POA: Diagnosis not present

## 2022-04-18 DIAGNOSIS — I2510 Atherosclerotic heart disease of native coronary artery without angina pectoris: Secondary | ICD-10-CM | POA: Diagnosis not present

## 2022-04-18 LAB — ECHOCARDIOGRAM COMPLETE
Area-P 1/2: 2.8 cm2
S' Lateral: 3.7 cm

## 2022-09-08 MED ORDER — TICAGRELOR 90 MG PO TABS: 90.0000 mg | ORAL_TABLET | Freq: Two times a day (BID) | ORAL | 2 refills | Status: DC

## 2022-12-03 ENCOUNTER — Other Ambulatory Visit: Payer: Self-pay

## 2022-12-03 ENCOUNTER — Emergency Department (HOSPITAL_COMMUNITY): Payer: BC Managed Care – PPO

## 2022-12-03 ENCOUNTER — Emergency Department (HOSPITAL_COMMUNITY)
Admission: EM | Admit: 2022-12-03 | Discharge: 2022-12-03 | Disposition: A | Discharge status: Home / Self Care | Payer: BC Managed Care – PPO | Attending: Emergency Medicine | Admitting: Emergency Medicine

## 2022-12-03 DIAGNOSIS — I2510 Atherosclerotic heart disease of native coronary artery without angina pectoris: Secondary | ICD-10-CM | POA: Diagnosis not present

## 2022-12-03 DIAGNOSIS — Z7982 Long term (current) use of aspirin: Secondary | ICD-10-CM | POA: Diagnosis not present

## 2022-12-03 DIAGNOSIS — T1490XA Injury, unspecified, initial encounter: Secondary | ICD-10-CM | POA: Diagnosis not present

## 2022-12-03 DIAGNOSIS — S0990XA Unspecified injury of head, initial encounter: Secondary | ICD-10-CM | POA: Insufficient documentation

## 2022-12-03 DIAGNOSIS — I959 Hypotension, unspecified: Secondary | ICD-10-CM | POA: Diagnosis not present

## 2022-12-03 DIAGNOSIS — R0689 Other abnormalities of breathing: Secondary | ICD-10-CM | POA: Diagnosis not present

## 2022-12-03 DIAGNOSIS — Z043 Encounter for examination and observation following other accident: Secondary | ICD-10-CM | POA: Diagnosis not present

## 2022-12-03 DIAGNOSIS — R55 Syncope and collapse: Secondary | ICD-10-CM | POA: Diagnosis not present

## 2022-12-03 DIAGNOSIS — W01198A Fall on same level from slipping, tripping and stumbling with subsequent striking against other object, initial encounter: Secondary | ICD-10-CM | POA: Diagnosis not present

## 2022-12-03 DIAGNOSIS — R61 Generalized hyperhidrosis: Secondary | ICD-10-CM | POA: Diagnosis not present

## 2022-12-03 LAB — CBC WITH DIFFERENTIAL/PLATELET
Abs Immature Granulocytes: 0.06 10*3/uL (ref 0.00–0.07)
Basophils Absolute: 0 10*3/uL (ref 0.0–0.1)
Basophils Relative: 0 %
Eosinophils Absolute: 0.1 10*3/uL (ref 0.0–0.5)
Eosinophils Relative: 0 %
HCT: 45 % (ref 39.0–52.0)
Hemoglobin: 15.3 g/dL (ref 13.0–17.0)
Immature Granulocytes: 0 %
Lymphocytes Relative: 14 %
Lymphs Abs: 1.9 10*3/uL (ref 0.7–4.0)
MCH: 31.1 pg (ref 26.0–34.0)
MCHC: 34 g/dL (ref 30.0–36.0)
MCV: 91.5 fL (ref 80.0–100.0)
Monocytes Absolute: 1.1 10*3/uL — ABNORMAL HIGH (ref 0.1–1.0)
Monocytes Relative: 8 %
Neutro Abs: 10.6 10*3/uL — ABNORMAL HIGH (ref 1.7–7.7)
Neutrophils Relative %: 78 %
Platelets: 244 10*3/uL (ref 150–400)
RBC: 4.92 MIL/uL (ref 4.22–5.81)
RDW: 14.6 % (ref 11.5–15.5)
WBC: 13.7 10*3/uL — ABNORMAL HIGH (ref 4.0–10.5)
nRBC: 0 % (ref 0.0–0.2)

## 2022-12-03 LAB — COMPREHENSIVE METABOLIC PANEL
ALT: 18 U/L (ref 0–44)
AST: 19 U/L (ref 15–41)
Albumin: 3.6 g/dL (ref 3.5–5.0)
Alkaline Phosphatase: 65 U/L (ref 38–126)
Anion gap: 10 (ref 5–15)
BUN: 18 mg/dL (ref 6–20)
CO2: 25 mmol/L (ref 22–32)
Calcium: 8.6 mg/dL — ABNORMAL LOW (ref 8.9–10.3)
Chloride: 102 mmol/L (ref 98–111)
Creatinine, Ser: 1.4 mg/dL — ABNORMAL HIGH (ref 0.61–1.24)
GFR, Estimated: 60 mL/min — ABNORMAL LOW (ref >60)
Glucose, Bld: 82 mg/dL (ref 70–99)
Potassium: 4.1 mmol/L (ref 3.5–5.1)
Sodium: 137 mmol/L (ref 135–145)
Total Bilirubin: 0.6 mg/dL (ref 0.3–1.2)
Total Protein: 6.8 g/dL (ref 6.5–8.1)

## 2022-12-03 LAB — ETHANOL: Alcohol, Ethyl (B): <10 mg/dL (ref <10)

## 2022-12-03 LAB — TROPONIN I (HIGH SENSITIVITY)
Troponin I (High Sensitivity): 6 ng/L (ref <18)
Troponin I (High Sensitivity): 6 ng/L (ref <18)

## 2022-12-03 MED ORDER — SODIUM CHLORIDE 0.9 % IV BOLUS
1000.0000 mL | Freq: Once | INTRAVENOUS | Status: AC
  Administered 2022-12-03: 1000 mL via INTRAVENOUS

## 2022-12-03 NOTE — ED Notes (Signed)
Pt care taken, orthostatic vs done, fluid started.

## 2022-12-03 NOTE — Discharge Instructions (Signed)
Get staples taken out and approximately 10 days.  Clean laceration twice a day with soap and water.  Drink plenty of fluids and rest inside the next couple days.  Follow-up with your family doctor this week

## 2022-12-03 NOTE — ED Triage Notes (Signed)
Pt arrived via [facility] from a family cookout that was outside, states was outside for around 3 hours and felt like he "got too hot" pt sat down and then fell back into rocks hitting his head, small laceration noted on the back of head, bleeding controled, Hx of previous heart attacks and a diabetic, cbg per ems was in the 120s. Pt was hypotensive before fluids

## 2022-12-03 NOTE — ED Provider Notes (Signed)
[place] EMERGENCY DEPARTMENT AT [facility] Provider Note   CSN: [id number] Arrival date & time: 12/03/22  1653     History {Add pertinent medical, surgical, social history, OB history to HPI:1} Chief Complaint  Patient presents with   Near Syncope    Hayden Ramirez is a 57 y.o. male.  Patient has a history of coronary disease.  Patient felt dizzy and weak after being outside for couple hours and sat down and then hit his head with syncope   Near Syncope       Home Medications Prior to Admission medications   Medication Sig Start Date End Date Taking? Authorizing Provider  acetaminophen (TYLENOL) 500 MG tablet Take 1,000 mg by mouth every 6 (six) hours as needed for mild pain.   Yes [provider]  aspirin EC 81 MG tablet Take 1 tablet (81 mg total) by mouth daily. Swallow whole. 11/24/21  Yes Arty Baumgartner, NP  atorvastatin (LIPITOR) 80 MG tablet Take 1 tablet (80 mg total) by mouth daily. 02/03/22  Yes Swinyer, Zachary George, NP  empagliflozin (JARDIANCE) 10 MG TABS tablet Take 1 tablet (10 mg total) by mouth daily. 02/03/22  Yes Swinyer, Zachary George, NP  losartan (COZAAR) 25 MG tablet Take 1 tablet (25 mg total) by mouth at bedtime. 02/03/22  Yes Swinyer, Zachary George, NP  metoprolol succinate (TOPROL-XL) 25 MG 24 hr tablet Take 1.5 tablets (37.5 mg total) by mouth at bedtime. 02/03/22  Yes Swinyer, Zachary George, NP  nitroGLYCERIN (NITROSTAT) 0.4 MG SL tablet Place 1 tablet (0.4 mg total) under the tongue every 5 (five) minutes as needed. 02/03/22  Yes Swinyer, Zachary George, NP  ticagrelor (BRILINTA) 90 MG TABS tablet Take 1 tablet (90 mg total) by mouth 2 (two) times daily. 09/08/22  Yes Orbie Pyo, MD      Allergies    Patient has no known allergies.    Review of Systems   Review of Systems  Cardiovascular:  Positive for near-syncope.    Physical Exam Updated Vital Signs BP (!) 137/115   Pulse 73   Temp 98 F (36.7 C) (Oral)   Resp (!)  28   Ht 5\' 11"  (1.803 m)   Wt 72.6 kg   SpO2 93%   BMI 22.32 kg/m  Physical Exam  ED Results / Procedures / Treatments   Labs (all labs ordered are listed, but only abnormal results are displayed) Labs Reviewed  CBC WITH DIFFERENTIAL/PLATELET - Abnormal; Notable for the following components:      Result Value   WBC 13.7 (*)    Neutro Abs 10.6 (*)    Monocytes Absolute 1.1 (*)    All other components within normal limits  COMPREHENSIVE METABOLIC PANEL - Abnormal; Notable for the following components:   Creatinine, Ser 1.40 (*)    Calcium 8.6 (*)    GFR, Estimated 60 (*)    All other components within normal limits  ETHANOL  TROPONIN I (HIGH SENSITIVITY)  TROPONIN I (HIGH SENSITIVITY)    EKG EKG Interpretation  Date/Time:  Saturday December 03 2022 17:03:00 EDT Ventricular Rate:  75 PR Interval:  145 QRS Duration: 99 QT Interval:  377 QTC Calculation: 421 R Axis:   16 Text Interpretation: Sinus rhythm Probable left atrial enlargement Inferior infarct, age indeterminate Abnormal lateral Q waves Confirmed by Bethann Berkshire [id number]) on 12/03/2022 6:59:23 PM  Radiology DG Chest Port 1 View  Result Date: 12/03/2022 CLINICAL DATA:  Status post fall. EXAM: PORTABLE CHEST  1 VIEW COMPARISON:  Nov 20, 2021 and July 19, 2012 FINDINGS: The heart size and mediastinal contours are within normal limits. A stable, benign 11 mm diameter nodular opacity is seen overlying the right lung base. There is no evidence of acute infiltrate, pleural effusion or pneumothorax. The visualized skeletal structures are unremarkable. IMPRESSION: No active disease. Electronically Signed   By: Aram Candela M.D.   On: 12/03/2022 17:58   CT Head Wo Contrast  Result Date: 12/03/2022 CLINICAL DATA:  Status post trauma. EXAM: CT HEAD WITHOUT CONTRAST TECHNIQUE: Contiguous axial images were obtained from the base of the skull through the vertex without intravenous contrast. RADIATION DOSE REDUCTION: This exam was  performed according to the departmental dose-optimization program which includes automated exposure control, adjustment of the mA and/or kV according to patient size and/or use of iterative reconstruction technique. COMPARISON:  Nov 20, 2021 FINDINGS: Brain: No evidence of acute infarction, hemorrhage, hydrocephalus, extra-axial collection or mass lesion/mass effect. A chronic right frontal lobe infarct is seen. This is present on the prior study. Vascular: No hyperdense vessel or unexpected calcification. Skull: Normal. Negative for fracture or focal lesion. Sinuses/Orbits: Moderate severity right maxillary sinus mucosal thickening is seen. A small to moderate size right maxillary sinus air-fluid level is also noted. 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 IMPRESSION: 1. No acute intracranial abnormality. 2. Chronic right frontal lobe infarct. 3. Moderate severity right maxillary sinus disease. Electronically Signed   By: Aram Candela M.D.   On: 12/03/2022 17:56    Procedures Procedures  {Document cardiac monitor, telemetry assessment procedure when appropriate:1}  Medications Ordered in ED Medications  sodium chloride 0.9 % bolus 1,000 mL (1,000 mLs Intravenous Bolus 12/03/22 1712)  sodium chloride 0.9 % bolus 1,000 mL (1,000 mLs Intravenous New Bag/Given 12/03/22 1910)    ED Course/ Medical Decision Making/ A&P  Patient had 2 cm laceration to his scalp that was stapled shut with 3 staples. {   Click here for ABCD2, HEART and other calculatorsREFRESH Note before signing :1}                          Medical Decision Making Amount and/or Complexity of Data Reviewed Labs: ordered. Radiology: ordered.   Patient with heat exhaustion and dehydration along with a head injury.  {Document critical care time when appropriate:1} {Document review of labs and clinical decision tools ie heart score, Chads2Vasc2 etc:1}  {Document your independent review of radiology  images, and any outside records:1} {Document your discussion with family members, caretakers, and with consultants:1} {Document social determinants of health affecting pt's care:1} {Document your decision making why or why not admission, treatments were needed:1} Final Clinical Impression(s) / ED Diagnoses Final diagnoses:  Syncope and collapse  Injury of head, initial encounter    Rx / DC Orders ED Discharge Orders     None

## 2022-12-07 ENCOUNTER — Telehealth: Payer: Self-pay

## 2022-12-07 NOTE — Transitions of Care (Post Inpatient/ED Visit) (Signed)
12/07/2022  Name: Hayden Ramirez MRN: [id number] DOB: 08/04/1965  Today's TOC FU Call Status: Today's TOC FU Call Status:: Successful TOC FU Call Competed TOC FU Call Complete Date: 12/07/22   Red on EMMI-ED Discharge Alert Date & Reason:12/06/22 "Scheduled follow-up appt? No"  Transition Care Management Follow-up Telephone Call Date of Discharge: 12/06/22 [facility] Type of Discharge: Emergency Department Reason for ED Visit: Other: ("syncope and collapse,injury of head") How have you been since you were released from the hospital?: Better (Pt reports no futher syncope or incidents. He voices no headaches since day of accident. His staples are intact and s/s of infection.) Any questions or concerns?: No  Items Reviewed: Did you receive and understand the discharge instructions provided?: Yes Medications obtained,verified, and reconciled?: Yes (Medications Reviewed) Any new allergies since your discharge?: No Dietary orders reviewed?: Yes Type of Diet Ordered:: low slat/heart healthy Do you have support at home?: Yes  Medications Reviewed Today: Medications Reviewed Today     Reviewed by Charlyn Minerva, RN (Registered Nurse) on 12/07/22 at 1454  Med List Status: <None>   Medication Order Taking? Sig Documenting Provider Last Dose Status Informant  acetaminophen (TYLENOL) 500 MG tablet [id number] Yes Take 1,000 mg by mouth every 6 (six) hours as needed for mild pain. [provider] Taking Active Self  aspirin EC 81 MG tablet [id number] Yes Take 1 tablet (81 mg total) by mouth daily. Swallow whole. Arty Baumgartner, NP Taking Active Self  atorvastatin (LIPITOR) 80 MG tablet [id number] Yes Take 1 tablet (80 mg total) by mouth daily. Swinyer, Zachary George, NP Taking Active Self  empagliflozin (JARDIANCE) 10 MG TABS tablet [id number] Yes Take 1 tablet (10 mg total) by mouth daily. Swinyer, Zachary George, NP Taking Active Self  losartan  (COZAAR) 25 MG tablet [id number] Yes Take 1 tablet (25 mg total) by mouth at bedtime. Swinyer, Zachary George, NP Taking Active Self  metoprolol succinate (TOPROL-XL) 25 MG 24 hr tablet [id number] Yes Take 1.5 tablets (37.5 mg total) by mouth at bedtime. Swinyer, Zachary George, NP Taking Active Self  nitroGLYCERIN (NITROSTAT) 0.4 MG SL tablet [id number] Yes Place 1 tablet (0.4 mg total) under the tongue every 5 (five) minutes as needed. Swinyer, Zachary George, NP Taking Active Self  ticagrelor (BRILINTA) 90 MG TABS tablet [id number] Yes Take 1 tablet (90 mg total) by mouth 2 (two) times daily. Orbie Pyo, MD Taking Active Self            Home Care and Equipment/Supplies: Were Home Health Services Ordered?: NA Any new equipment or medical supplies ordered?: NA  Functional Questionnaire: Do you need assistance with bathing/showering or dressing?: No Do you need assistance with meal preparation?: No Do you need assistance with eating?: No Do you have difficulty maintaining continence: No Do you need assistance with getting out of bed/getting out of a chair/moving?: No Do you have difficulty managing or taking your medications?: No  Follow up appointments reviewed: PCP Follow-up appointment confirmed?: No (Pt does not have a PCP-encouraged/instructed on obtaining one) MD Provider Line Number:[phone] Given: Yes Specialist Hospital Follow-up appointment confirmed?: No Reason Specialist Follow-Up Not Confirmed: Patient has Specialist Provider Number and will Call for Appointment (Pt will call cardiology to make appt and see if office will remove staples per d/c instructions) Do you need transportation to your follow-up appointment?: No Do you understand care options if your condition(s) worsen?: Yes-patient verbalized understanding  SDOH Interventions Today    Flowsheet Row Most  Recent Value  SDOH Interventions   Food Insecurity Interventions Intervention Not Indicated  Transportation  Interventions Intervention Not Indicated      TOC Interventions Today    Flowsheet Row Most Recent Value  TOC Interventions   TOC Interventions Discussed/Reviewed TOC Interventions Discussed      Interventions Today    Flowsheet Row Most Recent Value  General Interventions   General Interventions Discussed/Reviewed General Interventions Discussed, Doctor Visits  Doctor Visits Discussed/Reviewed Doctor Visits Discussed, Specialist, PCP  PCP/Specialist Visits Compliance with follow-up visit  Education Interventions   Education Provided Provided Education  Provided Verbal Education On When to see the doctor, Medication  Nutrition Interventions   Nutrition Discussed/Reviewed Nutrition Discussed  Pharmacy Interventions   Pharmacy Dicussed/Reviewed Pharmacy Topics Discussed, Medications and their functions  Safety Interventions   Safety Discussed/Reviewed Safety Discussed       Alessandra Grout [facility] Care Management Care Management Community Coordinator Direct Phone: [phone] Toll Free: [phone] Fax: [phone]

## 2022-12-18 ENCOUNTER — Other Ambulatory Visit: Payer: Self-pay

## 2022-12-18 ENCOUNTER — Ambulatory Visit
Admission: RE | Admit: 2022-12-18 | Discharge: 2022-12-18 | Disposition: A | Discharge status: Home / Self Care | Payer: BC Managed Care – PPO | Source: Ambulatory Visit

## 2022-12-18 VITALS — BP 117/79 | HR 60 | Temp 98.0°F | Resp 18

## 2022-12-18 DIAGNOSIS — Z4802 Encounter for removal of sutures: Secondary | ICD-10-CM

## 2022-12-18 NOTE — ED Triage Notes (Signed)
Pt here for staple removal from head; wound looks well healed

## 2022-12-18 NOTE — Discharge Instructions (Addendum)
Wound has healed and suture has been removed Follow-up with PCP Return or go to ED if you develop any new or worsening of symptoms

## 2022-12-18 NOTE — ED Provider Notes (Signed)
[facility]   [id number] 12/18/22 Arrival Time: [phone]   Chief Complaint  Patient presents with   Suture / Staple Removal    Entered by patient     SUBJECTIVE: History from: patient.  Hayden Ramirez is a 57 y.o. male who presents to the urgent care with a complaint of staple removal.  He has 3 staples directly on his head for wound repair.  Wound has healed.  He denies any aggravating factors.  Denies chills, fever, nausea, vomiting and diarrhea.   ROS: As per HPI.  All other pertinent ROS negative.     Past Medical History:  Diagnosis Date   CAD (coronary artery disease)    a. 01/2016: STEMI w/ 100% stenosis 1st Mrg (DES placed) and 90% stenosis mid-RCA (DES placed).    HLD (hyperlipidemia)    Tobacco use    Past Surgical History:  Procedure Laterality Date   CARDIAC CATHETERIZATION N/A 01/30/2016   Procedure: Left Heart Cath and Coronary Angiography;  Surgeon: Corky Crafts, MD;  Location: [facility] INVASIVE CV LAB;  Service: Cardiovascular;  Laterality: N/A;   CARDIAC CATHETERIZATION N/A 01/30/2016   Procedure: Coronary Stent Intervention;  Surgeon: Corky Crafts, MD;  Location: [facility] INVASIVE CV LAB;  Service: Cardiovascular;  Laterality: N/A;   CORONARY STENT INTERVENTION N/A 11/20/2021   Procedure: CORONARY STENT INTERVENTION;  Surgeon: Orbie Pyo, MD;  Location: [facility] INVASIVE CV LAB;  Service: Cardiovascular;  Laterality: N/A;   LEFT HEART CATH AND CORONARY ANGIOGRAPHY N/A 11/20/2021   Procedure: LEFT HEART CATH AND CORONARY ANGIOGRAPHY;  Surgeon: Orbie Pyo, MD;  Location: [facility] INVASIVE CV LAB;  Service: Cardiovascular;  Laterality: N/A;   No Known Allergies No current facility-administered medications on file prior to encounter.   Current Outpatient Medications on File Prior to Encounter  Medication Sig Dispense Refill   acetaminophen (TYLENOL) 500 MG tablet Take 1,000 mg by mouth every 6 (six) hours as needed for mild pain.     aspirin EC 81  MG tablet Take 1 tablet (81 mg total) by mouth daily. Swallow whole. 90 tablet 2   atorvastatin (LIPITOR) 80 MG tablet Take 1 tablet (80 mg total) by mouth daily. 90 tablet 3   empagliflozin (JARDIANCE) 10 MG TABS tablet Take 1 tablet (10 mg total) by mouth daily. 90 tablet 3   losartan (COZAAR) 25 MG tablet Take 1 tablet (25 mg total) by mouth at bedtime. 90 tablet 3   metoprolol succinate (TOPROL-XL) 25 MG 24 hr tablet Take 1.5 tablets (37.5 mg total) by mouth at bedtime. 135 tablet 3   nitroGLYCERIN (NITROSTAT) 0.4 MG SL tablet Place 1 tablet (0.4 mg total) under the tongue every 5 (five) minutes as needed. 25 tablet 5   ticagrelor (BRILINTA) 90 MG TABS tablet Take 1 tablet (90 mg total) by mouth 2 (two) times daily. 180 tablet 2   Social History   Socioeconomic History   Marital status: Single    Spouse name: Not on file   Number of children: Not on file   Years of education: Not on file   Highest education level: Not on file  Occupational History   Not on file  Tobacco Use   Smoking status: Light Smoker    Packs/day: 1    Types: Cigarettes   Smokeless tobacco: Never   Tobacco comments:    Smokes Black and Milds  Vaping Use   Vaping Use: Never used  Substance and Sexual Activity   Alcohol use: Yes  Comment: rare   Drug use: Yes    Types: Marijuana   Sexual activity: Not on file  Other Topics Concern   Not on file  Social History Narrative   Not on file   Social Determinants of Health   Financial Resource Strain: Not on file  Food Insecurity: No Food Insecurity (12/07/2022)   Hunger Vital Sign    Worried About Running Out of Food in the Last Year: Never true    Ran Out of Food in the Last Year: Never true  Transportation Needs: No Transportation Needs (12/07/2022)   PRAPARE - Administrator, Civil Service (Medical): No    Lack of Transportation (Non-Medical): No  Physical Activity: Not on file  Stress: Not on file  Social Connections: Not on file   Intimate Partner Violence: Not on file   Family History  Problem Relation Age of Onset   Heart attack Paternal Grandfather     OBJECTIVE:  Vitals:   12/18/22 1021  BP: 117/79  Pulse: 60  Resp: 18  Temp: 98 F (36.7 C)  TempSrc: Oral  SpO2: 98%     Physical Exam Vitals and nursing note reviewed.  Constitutional:      General: He is not in acute distress.    Appearance: He is well-developed.  HENT:     Head: Normocephalic and atraumatic.  Cardiovascular:     Rate and Rhythm: Normal rate and regular rhythm.     Heart sounds: Normal heart sounds. No murmur heard. Pulmonary:     Effort: Pulmonary effort is normal. No respiratory distress.     Breath sounds: Normal breath sounds.  Abdominal:     Tenderness: There is no abdominal tenderness.  Musculoskeletal:        General: No swelling.  Skin:    General: Skin is warm and dry.     Comments: Healed wound with 3 staples present on the side  Neurological:     Mental Status: He is alert.       LABS:  No results found for this or any previous visit (from the past 24 hour(s)).   ASSESSMENT & PLAN:  1. Visit for suture removal     No orders of the defined types were placed in this encounter.   Discharge instructions  Wound has healed and suture has been removed Follow-up with PCP Return or go to ED if you develop any new or worsening of symptoms  Reviewed expectations re: course of current medical issues. Questions answered. Outlined signs and symptoms indicating need for more acute intervention. Patient verbalized understanding. After Visit Summary given.          Durward Parcel, FNP 12/18/22 1034

## 2023-01-03 MED ORDER — LOSARTAN POTASSIUM 25 MG PO TABS: 25.0000 mg | ORAL_TABLET | Freq: Every day | ORAL | 1 refills | Status: AC

## 2023-02-13 ENCOUNTER — Other Ambulatory Visit: Payer: Self-pay | Admitting: Nurse Practitioner

## 2023-02-15 ENCOUNTER — Other Ambulatory Visit: Payer: Self-pay | Admitting: Nurse Practitioner

## 2023-03-08 ENCOUNTER — Other Ambulatory Visit: Payer: Self-pay | Admitting: Nurse Practitioner

## 2023-03-08 NOTE — Telephone Encounter (Signed)
Please contact pt for future appointment. Pt overdue for 6 month f/u.  

## 2023-04-11 ENCOUNTER — Other Ambulatory Visit: Payer: Self-pay | Admitting: Internal Medicine

## 2023-04-17 ENCOUNTER — Other Ambulatory Visit: Payer: Self-pay | Admitting: Nurse Practitioner

## 2023-04-18 ENCOUNTER — Other Ambulatory Visit: Payer: Self-pay | Admitting: Internal Medicine

## 2023-04-27 ENCOUNTER — Encounter: Payer: Self-pay | Admitting: Internal Medicine

## 2023-05-07 ENCOUNTER — Other Ambulatory Visit: Payer: Self-pay | Admitting: Internal Medicine

## 2023-05-14 ENCOUNTER — Other Ambulatory Visit: Payer: Self-pay | Admitting: Internal Medicine

## 2023-05-14 ENCOUNTER — Other Ambulatory Visit: Payer: Self-pay | Admitting: Nurse Practitioner

## 2023-05-29 ENCOUNTER — Other Ambulatory Visit: Payer: Self-pay | Admitting: Nurse Practitioner

## 2023-05-31 ENCOUNTER — Other Ambulatory Visit: Payer: Self-pay

## 2023-06-07 ENCOUNTER — Other Ambulatory Visit: Payer: Self-pay | Admitting: Internal Medicine

## 2023-06-07 ENCOUNTER — Other Ambulatory Visit: Payer: Self-pay | Admitting: Nurse Practitioner

## 2023-06-08 ENCOUNTER — Other Ambulatory Visit: Payer: Self-pay | Admitting: Internal Medicine

## 2023-07-21 ENCOUNTER — Other Ambulatory Visit: Payer: Self-pay | Admitting: Nurse Practitioner

## 2023-07-21 ENCOUNTER — Other Ambulatory Visit: Payer: Self-pay | Admitting: Internal Medicine

## 2023-08-06 ENCOUNTER — Other Ambulatory Visit: Payer: Self-pay | Admitting: Nurse Practitioner

## 2023-08-19 ENCOUNTER — Other Ambulatory Visit: Payer: Self-pay | Admitting: Nurse Practitioner

## 2023-08-21 NOTE — Telephone Encounter (Signed)
 Pt of Dr. Lynnette Caffey. He is overdue and passed his 3rd attempt. Does Dr. Lynnette Caffey want to refill? Please advise.

## 2023-08-21 NOTE — Telephone Encounter (Signed)
 Message to patient and to scheduling.  Pt needs appointment.

## 2023-08-24 ENCOUNTER — Other Ambulatory Visit: Payer: Self-pay

## 2023-08-24 MED ORDER — METOPROLOL SUCCINATE ER 25 MG PO TB24: ORAL_TABLET | ORAL | 0 refills | Status: AC

## 2023-09-12 ENCOUNTER — Other Ambulatory Visit: Payer: Self-pay | Admitting: Internal Medicine

## 2023-09-14 NOTE — Telephone Encounter (Signed)
 Dr. Trula Ore pt. Passed his 3rd attempt. Does Dr. Lynnette Caffey want to refill? Please advise

## 2023-09-15 NOTE — Telephone Encounter (Signed)
 Called and left message for patient to call and schedule an appointment.  When he schedules, we can send more refills.

## 2023-11-28 ENCOUNTER — Other Ambulatory Visit (HOSPITAL_BASED_OUTPATIENT_CLINIC_OR_DEPARTMENT_OTHER): Payer: Self-pay
